# Patient Record
Sex: Female | Born: 1950 | Race: White | Hispanic: No | Marital: Married | Smoking: Former smoker
Health system: Southern US, Community
[De-identification: ages and names within clinical notes are randomized; demographics above are authoritative.]

## PROBLEM LIST (undated history)

## (undated) DIAGNOSIS — C801 Malignant (primary) neoplasm, unspecified: Secondary | ICD-10-CM

## (undated) DIAGNOSIS — Z8719 Personal history of other diseases of the digestive system: Secondary | ICD-10-CM

## (undated) DIAGNOSIS — J45909 Unspecified asthma, uncomplicated: Secondary | ICD-10-CM

## (undated) DIAGNOSIS — E039 Hypothyroidism, unspecified: Secondary | ICD-10-CM

## (undated) DIAGNOSIS — R51 Headache: Secondary | ICD-10-CM

## (undated) DIAGNOSIS — E119 Type 2 diabetes mellitus without complications: Secondary | ICD-10-CM

## (undated) DIAGNOSIS — K219 Gastro-esophageal reflux disease without esophagitis: Secondary | ICD-10-CM

## (undated) DIAGNOSIS — R112 Nausea with vomiting, unspecified: Secondary | ICD-10-CM

## (undated) DIAGNOSIS — Z9889 Other specified postprocedural states: Secondary | ICD-10-CM

## (undated) HISTORY — PX: TONSILLECTOMY: SUR1361

## (undated) HISTORY — PX: BUNIONECTOMY: SHX129

## (undated) HISTORY — PX: THYROID SURGERY: SHX805

## (undated) HISTORY — PX: KNEE ARTHROSCOPY: SUR90

## (undated) HISTORY — PX: FOOT SURGERY: SHX648

## (undated) HISTORY — PX: CARPAL TUNNEL RELEASE: SHX101

---

## 1997-08-11 ENCOUNTER — Other Ambulatory Visit: Admission: RE | Admit: 1997-08-11 | Discharge: 1997-08-11 | Payer: Self-pay | Admitting: Gynecology

## 1998-05-22 ENCOUNTER — Other Ambulatory Visit: Admission: RE | Admit: 1998-05-22 | Discharge: 1998-05-22 | Payer: Self-pay | Admitting: Gynecology

## 1998-05-22 ENCOUNTER — Other Ambulatory Visit: Admission: RE | Admit: 1998-05-22 | Discharge: 1998-05-22 | Payer: Self-pay | Admitting: Obstetrics and Gynecology

## 1998-06-18 ENCOUNTER — Ambulatory Visit (HOSPITAL_COMMUNITY): Admission: RE | Admit: 1998-06-18 | Discharge: 1998-06-18 | Payer: Self-pay | Admitting: Obstetrics and Gynecology

## 1998-11-16 ENCOUNTER — Ambulatory Visit (HOSPITAL_COMMUNITY): Admission: RE | Admit: 1998-11-16 | Discharge: 1998-11-16 | Payer: Self-pay | Admitting: Gastroenterology

## 1999-02-03 ENCOUNTER — Encounter: Admission: RE | Admit: 1999-02-03 | Discharge: 1999-02-03 | Payer: Self-pay | Admitting: Orthopaedic Surgery

## 1999-02-03 ENCOUNTER — Encounter: Payer: Self-pay | Admitting: Orthopaedic Surgery

## 1999-02-12 ENCOUNTER — Encounter: Payer: Self-pay | Admitting: Orthopaedic Surgery

## 1999-02-12 ENCOUNTER — Encounter: Admission: RE | Admit: 1999-02-12 | Discharge: 1999-02-12 | Payer: Self-pay | Admitting: Orthopaedic Surgery

## 1999-02-15 ENCOUNTER — Encounter: Admission: RE | Admit: 1999-02-15 | Discharge: 1999-02-15 | Payer: Self-pay | Admitting: Orthopaedic Surgery

## 1999-02-15 ENCOUNTER — Encounter: Payer: Self-pay | Admitting: Orthopaedic Surgery

## 1999-04-20 ENCOUNTER — Encounter: Payer: Self-pay | Admitting: Orthopaedic Surgery

## 1999-04-20 ENCOUNTER — Encounter: Admission: RE | Admit: 1999-04-20 | Discharge: 1999-04-20 | Payer: Self-pay | Admitting: Orthopaedic Surgery

## 1999-05-17 ENCOUNTER — Encounter: Payer: Self-pay | Admitting: Urology

## 1999-05-17 ENCOUNTER — Encounter: Admission: RE | Admit: 1999-05-17 | Discharge: 1999-05-17 | Payer: Self-pay | Admitting: Urology

## 1999-06-16 ENCOUNTER — Inpatient Hospital Stay (HOSPITAL_COMMUNITY): Admission: RE | Admit: 1999-06-16 | Discharge: 1999-06-18 | Payer: Self-pay | Admitting: Obstetrics and Gynecology

## 1999-06-16 ENCOUNTER — Encounter (INDEPENDENT_AMBULATORY_CARE_PROVIDER_SITE_OTHER): Payer: Self-pay | Admitting: Specialist

## 2001-02-16 ENCOUNTER — Other Ambulatory Visit: Admission: RE | Admit: 2001-02-16 | Discharge: 2001-02-16 | Payer: Self-pay | Admitting: Obstetrics and Gynecology

## 2002-03-29 ENCOUNTER — Other Ambulatory Visit: Admission: RE | Admit: 2002-03-29 | Discharge: 2002-03-29 | Payer: Self-pay | Admitting: Obstetrics and Gynecology

## 2002-04-18 HISTORY — PX: DILATION AND CURETTAGE OF UTERUS: SHX78

## 2003-04-19 HISTORY — PX: ABDOMINAL HYSTERECTOMY: SHX81

## 2003-04-23 ENCOUNTER — Other Ambulatory Visit: Admission: RE | Admit: 2003-04-23 | Discharge: 2003-04-23 | Payer: Self-pay | Admitting: Obstetrics and Gynecology

## 2004-06-04 ENCOUNTER — Other Ambulatory Visit: Admission: RE | Admit: 2004-06-04 | Discharge: 2004-06-04 | Payer: Self-pay | Admitting: Obstetrics and Gynecology

## 2005-03-31 ENCOUNTER — Ambulatory Visit (HOSPITAL_COMMUNITY): Admission: RE | Admit: 2005-03-31 | Discharge: 2005-04-01 | Payer: Self-pay | Admitting: General Surgery

## 2005-03-31 ENCOUNTER — Encounter (INDEPENDENT_AMBULATORY_CARE_PROVIDER_SITE_OTHER): Payer: Self-pay | Admitting: *Deleted

## 2005-04-18 HISTORY — PX: CHOLECYSTECTOMY: SHX55

## 2006-05-11 ENCOUNTER — Ambulatory Visit (HOSPITAL_COMMUNITY): Admission: RE | Admit: 2006-05-11 | Discharge: 2006-05-11 | Payer: Self-pay | Admitting: Endocrinology

## 2006-05-30 ENCOUNTER — Encounter (HOSPITAL_COMMUNITY): Admission: RE | Admit: 2006-05-30 | Discharge: 2006-08-03 | Payer: Self-pay | Admitting: Endocrinology

## 2006-06-12 ENCOUNTER — Other Ambulatory Visit: Admission: RE | Admit: 2006-06-12 | Discharge: 2006-06-12 | Payer: Self-pay | Admitting: Interventional Radiology

## 2006-06-12 ENCOUNTER — Encounter (INDEPENDENT_AMBULATORY_CARE_PROVIDER_SITE_OTHER): Payer: Self-pay | Admitting: Specialist

## 2006-06-12 ENCOUNTER — Encounter: Admission: RE | Admit: 2006-06-12 | Discharge: 2006-06-12 | Payer: Self-pay | Admitting: Endocrinology

## 2007-01-05 ENCOUNTER — Ambulatory Visit (HOSPITAL_COMMUNITY): Admission: RE | Admit: 2007-01-05 | Discharge: 2007-01-05 | Payer: Self-pay | Admitting: Endocrinology

## 2007-02-27 ENCOUNTER — Encounter: Admission: RE | Admit: 2007-02-27 | Discharge: 2007-02-27 | Payer: Self-pay | Admitting: Rheumatology

## 2007-07-20 ENCOUNTER — Encounter: Admission: RE | Admit: 2007-07-20 | Discharge: 2007-07-20 | Payer: Self-pay | Admitting: Endocrinology

## 2007-11-20 ENCOUNTER — Encounter (INDEPENDENT_AMBULATORY_CARE_PROVIDER_SITE_OTHER): Payer: Self-pay | Admitting: Surgery

## 2007-11-20 ENCOUNTER — Ambulatory Visit (HOSPITAL_COMMUNITY): Admission: RE | Admit: 2007-11-20 | Discharge: 2007-11-21 | Payer: Self-pay | Admitting: Surgery

## 2007-12-31 ENCOUNTER — Ambulatory Visit (HOSPITAL_COMMUNITY): Admission: RE | Admit: 2007-12-31 | Discharge: 2008-01-02 | Payer: Self-pay | Admitting: Endocrinology

## 2009-12-23 ENCOUNTER — Encounter: Admission: RE | Admit: 2009-12-23 | Discharge: 2009-12-23 | Payer: Self-pay | Admitting: Surgery

## 2010-05-09 ENCOUNTER — Encounter: Payer: Self-pay | Admitting: Endocrinology

## 2010-05-10 ENCOUNTER — Encounter: Payer: Self-pay | Admitting: Endocrinology

## 2010-05-27 IMAGING — NM NM RAI THERAPY CANCER THYROID
1 series · 1 of 1 positions shown · non-contrast
Comparison: None

CLINICAL DATA: Thyroid cancer

NUCLEAR MEDICINE RADIOACTIVE IODINE THERAPY FOR THYROID CANCER
Radiopharmaceutical: 104.9 mCi H-MKM.  The patient received 2
injections of Thyrogen 0.9 mg each on [REDACTED] and [REDACTED] of this
week.

[Series 1: st static image · 1 of 1 slices shown]
[im 1/1]
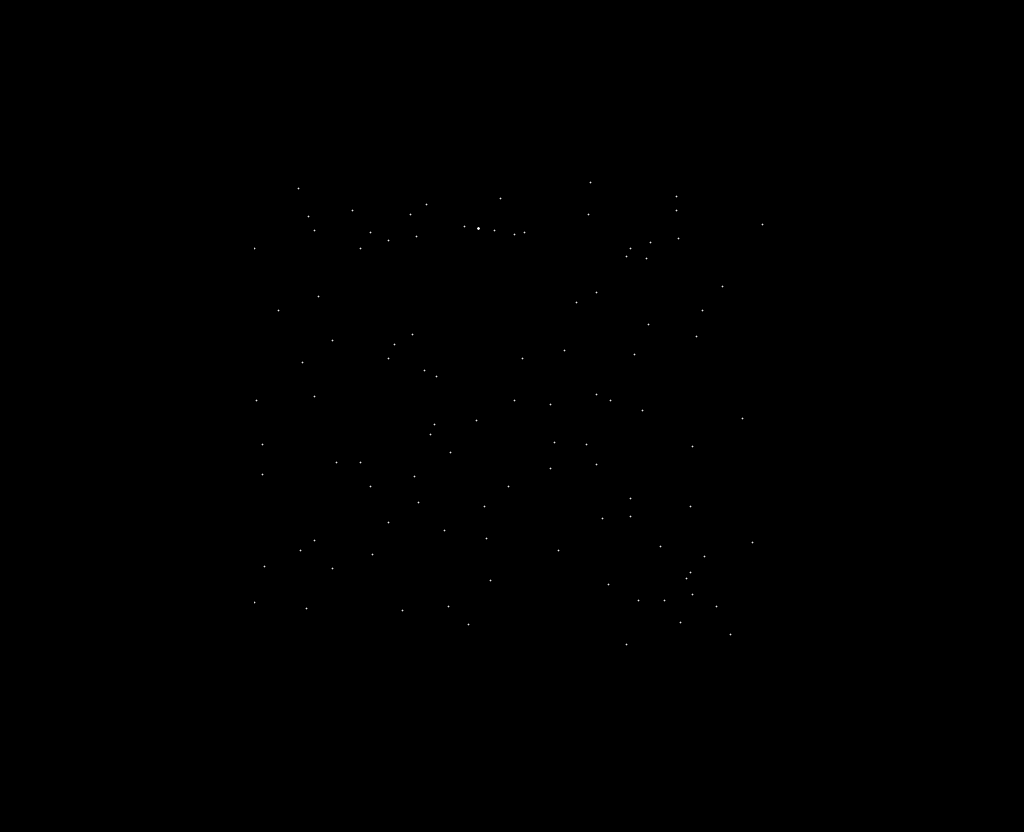

[1 of 1 positions shown; findings below may reference images not displayed]

FINDINGS: Written informed consent was obtained.  I explained the
treatment to the patient in detail including risks, complications
and precautions.  All questions were answered.
IMPRESSION: 1.  Treatment of thyroid cancer with 104.9 mCi H-MKM orally

## 2010-08-31 NOTE — Op Note (Signed)
NAMEAURIAH, [name]                 ACCOUNT NO.:  [id number]   MEDICAL RECORD NO.:  [id number]          PATIENT TYPE:  AMB   LOCATION:  DAY                          FACILITY:  [facility]   PHYSICIAN:  Stephanie Heckler, MD      DATE OF BIRTH:  11/04/1950   DATE OF PROCEDURE:  11/20/2007  DATE OF DISCHARGE:                               OPERATIVE REPORT   PREOPERATIVE DIAGNOSIS:  Multinodular thyroid goiter with compressive  symptoms.   POSTOPERATIVE DIAGNOSIS:  Multinodular thyroid goiter with compressive  symptoms.   PROCEDURE:  Total thyroidectomy.   SURGEON:  Stephanie Heckler, MD, FACS   ASSISTANT:  Stephanie Bent, MD, FACS   ANESTHESIA:  General per [name]. [name], M.D.   ESTIMATED BLOOD LOSS:  Minimal.   PREPARATION:  Betadine.   COMPLICATIONS:  None.   INDICATIONS:  The patient is a 60 year old white female from [place],  [place].  She is referred by [name], M.D. with known  bilateral thyroid nodules, thyroid goiter, and development of mild  compressive symptoms.  Ultrasound showed an increase in size of a right-  sided dominant nodule with internal calcifications.  The patient now  comes to surgery for resection.   BODY OF REPORT:  Procedure is done in OR #6 at the [facility].  The patient is brought to the operating room, placed in  supine position on the operating room table.  Following administration  of general anesthesia, the patient is positioned and then prepped and  draped in the usual strict aseptic fashion.  After ascertaining that an  adequate level of anesthesia been achieved, a Kocher incision is made a  #15 blade.  Dissection is carried through subcutaneous tissues and  platysma.  Hemostasis was obtained with electrocautery.  Skin flaps were  elevated cephalad and caudad from the thyroid notch to the sternal  notch.  A Mahorner self-retaining retractor was placed for exposure.  Strap muscles were incised in the midline  and dissection was begun on  the left side of the neck.  There is a dominant nodule in the thyroid  isthmus.  Left lobe was mildly enlarged and is quite firm.  Strap  muscles were reflected laterally.  Venous tributaries were divided  between hemoclips with the harmonic scalpel.  Gland is gently mobilized.  Superior pole is dissected out.  Superior pole vessels were ligated in  continuity with 2-0 silk ties and medium hemoclips and divided with the  harmonic scalpel.  Gland is rolled anteriorly.  Branches of the inferior  thyroid artery are divided between small hemoclips with the harmonic  scalpel.  Parathyroid tissue was identified and preserved.  Recurrent  nerve was identified and preserved.  Ligament of Allyson Sabal is transected  with electrocautery and the gland is mobilized up and onto the anterior  trachea.  A small pyramidal lobe was dissected away from the thyroid  cartilage and hemostasis obtained with electrocautery.   Next we turned our attention to the right lobe.  The right thyroid lobe  shows a dominant nodule in the medial portion  of the lobe adjacent to  the isthmus.  Also there is a smaller less than 1 cm calcified nodule  posteriorly.  With some difficulty the lobe was exposed by reflecting  the strap muscles laterally.  Venous tributaries were divided between  Ligaclips with the harmonic scalpel.  Superior pole vessels were  dissected out and ligated in continuity with 2-0 silk ties and divided  with the harmonic scalpel.  Gland is rolled anteriorly.  Branches of the  inferior thyroid artery are divided between small hemoclips.  Parathyroid tissue was identified superiorly and inferiorly and both of  these were preserved.  Recurrent nerve was identified and preserved.  Gland is rolled medially and the ligament of Allyson Sabal is transected with  electrocautery.  Gland is excised off the anterior trachea.  Sutures  used to mark the right superior pole.  The entire gland is  submitted to  pathology for review.  Neck is irrigated with warm saline and hemostasis  was obtained with electrocautery.  Surgicel was placed in the operative  field bilaterally.  Strap muscles were reapproximated in the midline  with interrupted 3-0 Vicryl sutures.  Platysma was closed with  interrupted 3-0 Vicryl sutures.  Skin was closed with running 4-0  Monocryl subcuticular suture.  Wound is washed and dried.  Benzoin and  Steri-Strips are applied.  Sterile dressings were applied.  The patient  was awakened from anesthesia and brought to the recovery room in stable  condition.  The patient tolerated the procedure well.      Stephanie Heckler, MD  Electronically Signed     TMG/MEDQ  D:  11/20/2007  T:  11/20/2007  Job:  [id number]   cc:   [name], M.D.  Fax: [phone]   [name], M.D.   Dr. Foye Barker

## 2010-09-03 NOTE — Op Note (Signed)
NAMEELLIA, KNOWLTON                 ACCOUNT NO.:  [id number]   MEDICAL RECORD NO.:  [id number]          PATIENT TYPE:  AMB   LOCATION:  SDS                          FACILITY:  [facility]   PHYSICIAN:  Cherylynn Ridges, M.D.    DATE OF BIRTH:  Apr 27, 1950   DATE OF PROCEDURE:  03/31/2005  DATE OF DISCHARGE:                                 OPERATIVE REPORT   PREOPERATIVE DIAGNOSIS:  Symptomatic biliary dyskinesia.   POSTOPERATIVE DIAGNOSIS:  Symptomatic biliary dyskinesia.   PROCEDURE:  Laparoscopic cholecystectomy with cholangiogram.   SURGEON:  Cherylynn Ridges, M.D.   ASSISTANT:  Currie Paris, M.D.   ANESTHESIA:  General endotracheal.   ESTIMATED BLOOD LOSS:  Less than 20 mL.   COMPLICATIONS:  None.   CONDITION:  Stable.   INDICATIONS FOR OPERATION:  The patient is a 60 year old with a dyskinetic  gallbladder and symptoms related to that who now comes in for an elective  laparoscopic cholecystectomy.   FINDINGS:  The patient had no evidence of adhesions or acute gallbladder  disease.  Cholangiogram was normal with good flow into the duodenum, no  filling defects, no dilated common bile ducts and good proximal flow.   OPERATION:  The patient was taken to the operating room and placed on the  table in a supine position.  After an adequate endotracheal anesthetic was  administered, she was prepped and draped in the usual sterile manner,  exposing the midline and the right upper quadrant of the abdomen.   A supraumbilical curvilinear incision was made using a #11 blade and taken  down to the midline fascia.  It was at this level that the fascia was  grasped with Kocher clamps and with retractors in place, we made an incision  between the Kocher clamps into the fascia, into the preperitoneal area.  We  then used a Kelly clamp to bluntly dissect into the peritoneal cavity.  Once  this was done, a pursestring suture of 0 Vicryl was passed around the  fascial opening and then a  Hasson cannula passed into the peritoneal cavity.  This secured in place with the pursestring 0 Vicryl stitch.   Carbon dioxide insufflation was instilled through the cannula into the  peritoneal cavity up to a maximal intra-abdominal pressure of 15 mmHg.  We  then passed 2 right costal margin 5-mm cannulas and a subxiphoid 11/12-mm  cavity under direct vision into the peritoneal cavity.  Once all cannulas  were placed, the patient was placed in reversed Trendelenburg position, the  left side was tilted down and the dissection begun.   The gallbladder was grasped and retracted towards the right upper quadrant.  We then dissected out the peritoneum overlying the triangle of Fallot and  the hepatoduodenal triangle.  There was an artery that coursed lateral to  the cystic duct which was then clipped doubly and subsequently transected.  We then dissected out the cystic duct and performed a choledochotomy after  passing a clip along the gallbladder side.  It was through the  choledochotomy that a [facility], which had been passed  through  the anterior abdominal wall, was passed in order to perform cholangiogram.  The cholangiogram showed good flow into the duodenum, good proximal filling  of the hepatic ducts, no intraductal filling defects and no dilatation.  Once this was demonstrated, we removed the clips and the catheter, then  endoclipped the distal duct x2 and then transected the cystic duct.  We then  dissected out what appeared to be a large cystic artery with branches going  to the gallbladder; these were individually clipped x2 and then cauterized  with a clip.  During the cautery of one of these branches, we punched the  gallbladder with spillage of clear bile.  We dissected out the gallbladder  from its bed with minimal difficulty, using an EndoCatch bag to bring it out  through the supraumbilical fascia with minimal difficulty.  We then closed  out the fascia side of  the supraumbilical site using a pursestring suture of  0 Vicryl.  We irrigated with 2 L of saline in the right upper quadrant  because of spillage of bile.  There was minimal bleeding in the gallbladder  bed, but it was controlled with electrocautery.  No drains were left in  place.  All counts were correct and once this was done, we removed all fluid  and gas using the aspirator and once this was done, we closed.  Each site  was closed using a running subcuticular stitch of 4-0 Vicryl.  0.25%  Marcaine with epinephrine was injected at all sites prior to closure.  Sterile dressings were applied.      Cherylynn Ridges, M.D.  Electronically Signed     JOW/MEDQ  D:  03/31/2005  T:  04/04/2005  Job:  [id number]

## 2011-01-14 LAB — CBC
HCT: 41.4
MCHC: 34.4
MCV: 90.1
RBC: 4.59
WBC: 5.5

## 2011-01-14 LAB — URINALYSIS, ROUTINE W REFLEX MICROSCOPIC
Protein, ur: NEGATIVE
Urobilinogen, UA: 0.2

## 2011-01-14 LAB — COMPREHENSIVE METABOLIC PANEL
ALT: 22
Alkaline Phosphatase: 50
Calcium: 9.6
Creatinine, Ser: 0.47
GFR calc Af Amer: >60
Potassium: 3.8

## 2011-01-14 LAB — DIFFERENTIAL
Basophils Absolute: 0
Eosinophils Absolute: 0.1
Eosinophils Relative: 1
Lymphocytes Relative: 30
Monocytes Absolute: 0.5
Neutro Abs: 3.3

## 2011-01-14 LAB — PROTIME-INR: Prothrombin Time: 12.6

## 2012-02-08 ENCOUNTER — Other Ambulatory Visit: Payer: Self-pay | Admitting: Obstetrics and Gynecology

## 2012-05-17 IMAGING — US US SOFT TISSUE HEAD/NECK
1 series · 14 of 25 positions shown · non-contrast
Comparison: Ultrasound of the thyroid pre thyroidectomy on
07/20/2007

CLINICAL DATA: Question of palpable abnormality in the left
supraclavicular region, prior thyroidectomy, on Synthroid

THYROID ULTRASOUND
TECHNIQUE: Ultrasound examination of the thyroid bed and adjacent
soft tissues was performed.

[Series 1: us soft tissue head/neck · 0.07mm/px · 14 of 56 slices shown]
[im 1/56]
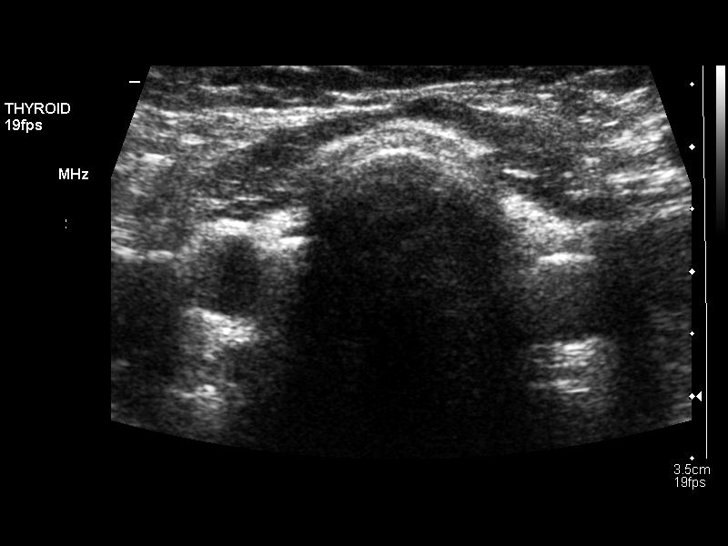
[im 5/56]
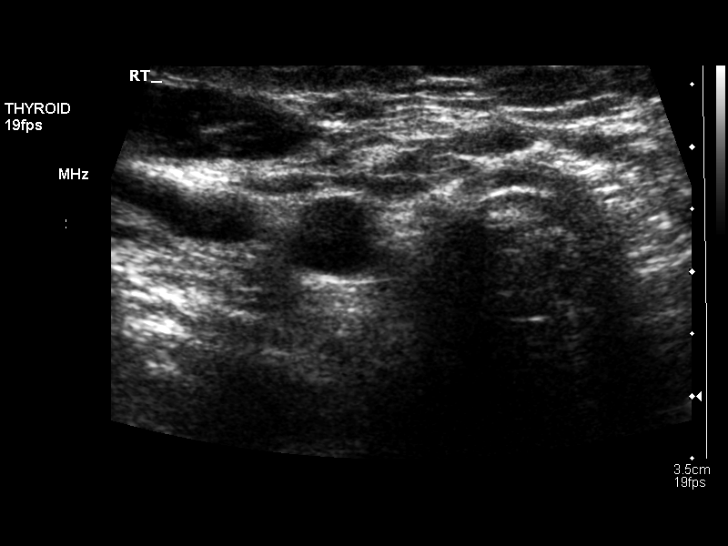
[im 10/56]
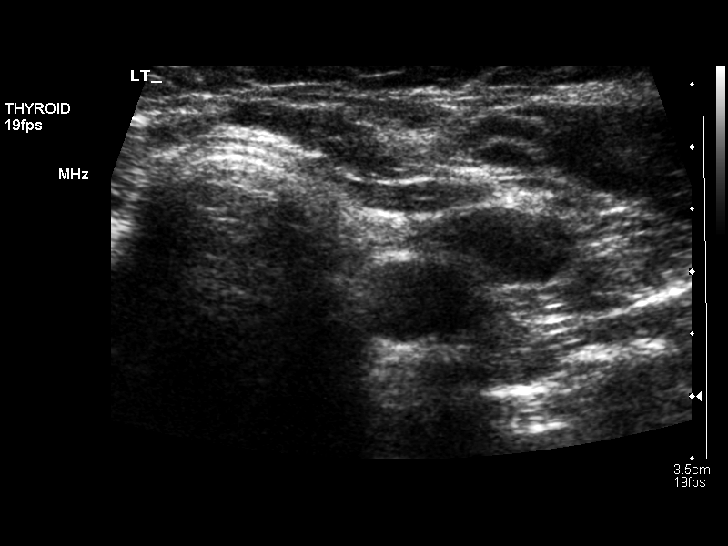
[im 14/56]
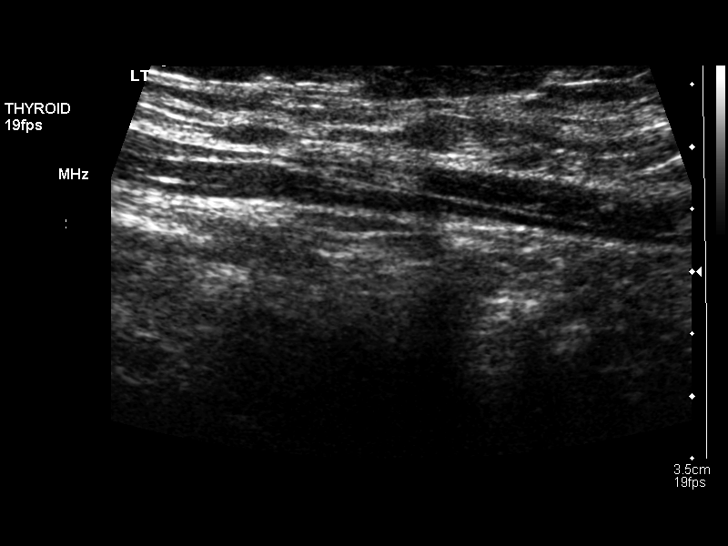
[im 19/56]
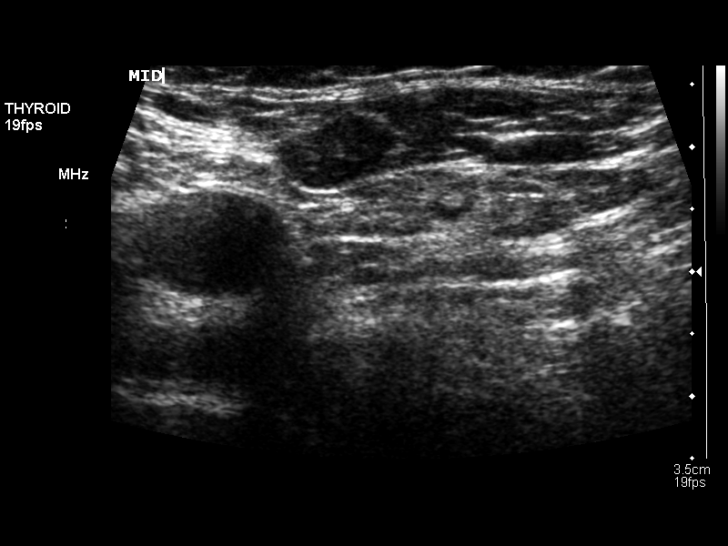
[im 21/56]
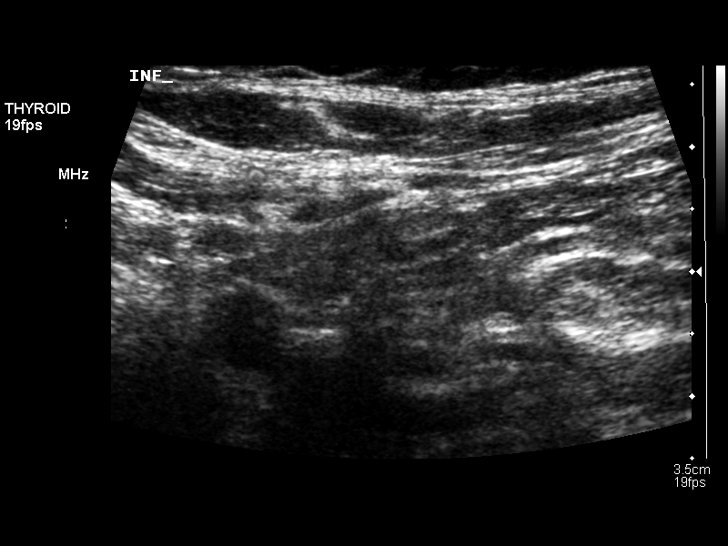
[im 26/56]
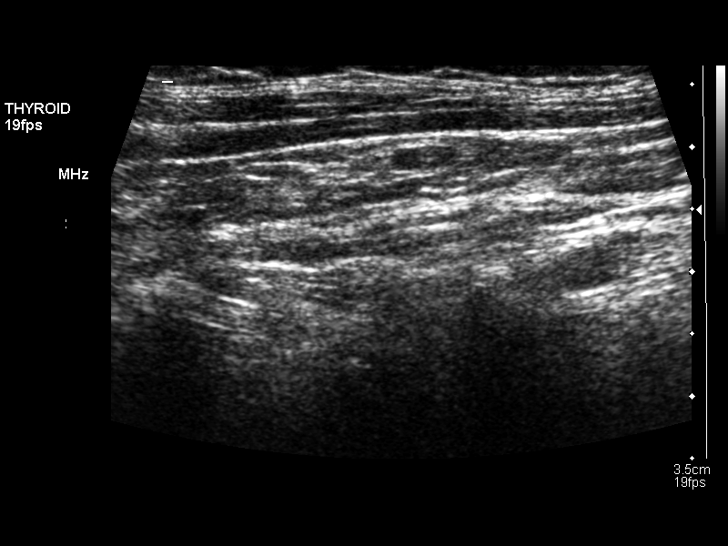
[im 30/56]
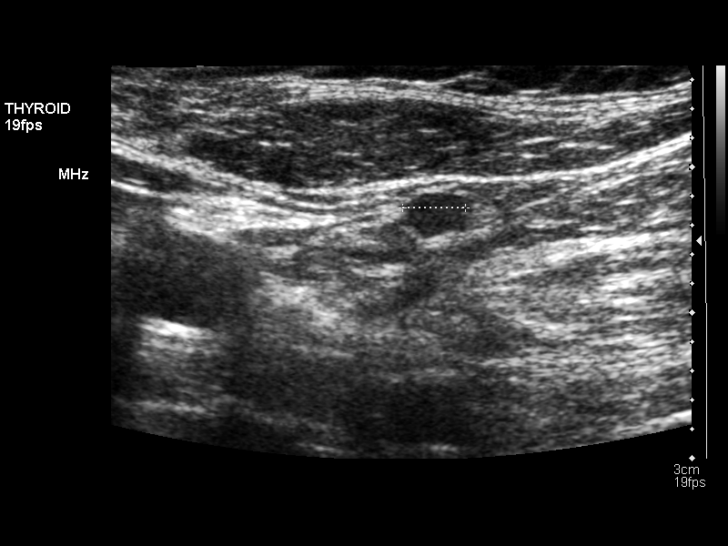
[im 35/56]
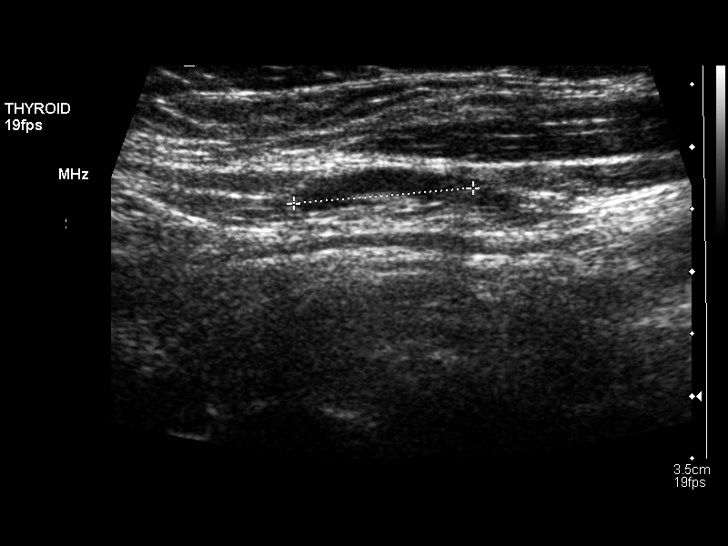
[im 37/56]
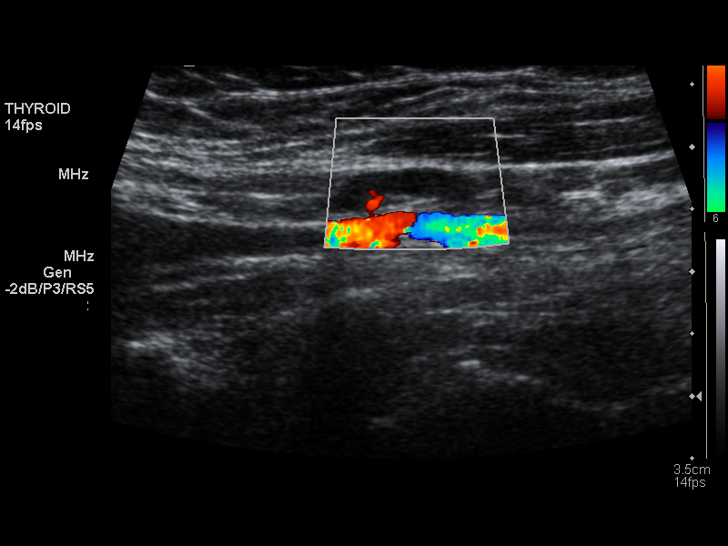
[im 42/56]
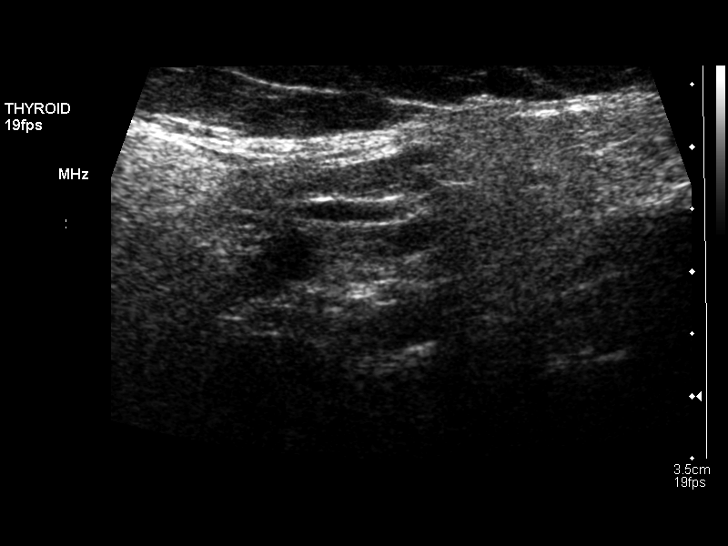
[im 46/56]
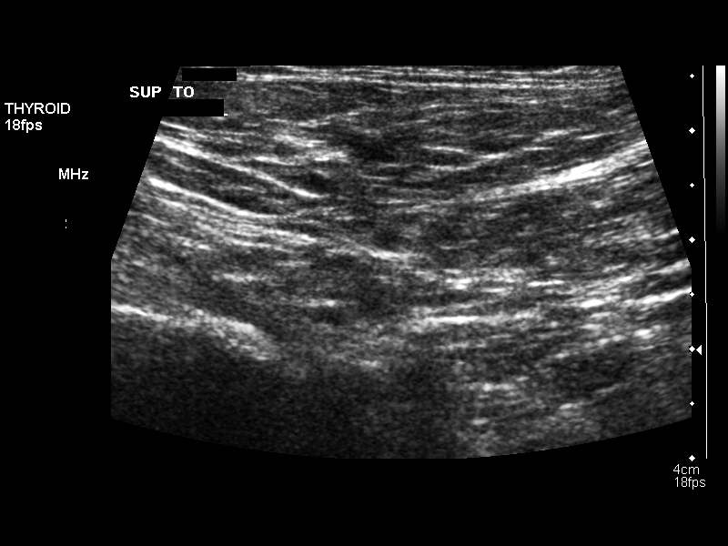
[im 51/56]
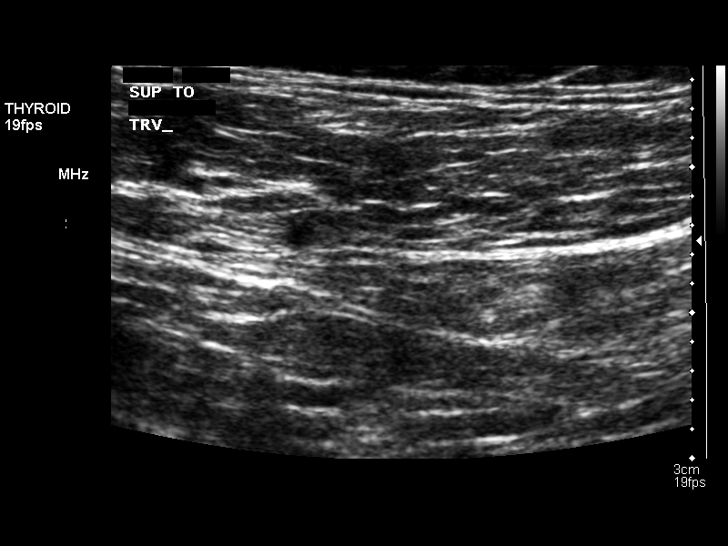
[im 56/56]
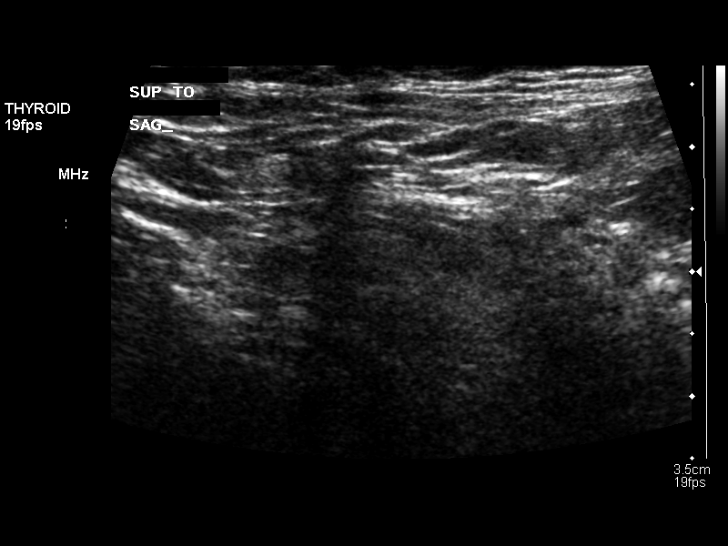

[14 of 25 positions shown; findings below may reference images not displayed]

FINDINGS: The thyroid is surgically absent.  No mass in the
thyroid bed.

Lymphadenopathy:  There are a few nodes bilaterally none of which
appear pathologically enlarged, appearing relatively normal by
ultrasound.  No mass is seen in the area questioned on palpation.
IMPRESSION: Normal post-thyroidectomy appearance. No adenopathy.

## 2013-10-11 ENCOUNTER — Other Ambulatory Visit: Payer: Self-pay | Admitting: Orthopedic Surgery

## 2013-10-21 ENCOUNTER — Encounter (HOSPITAL_COMMUNITY): Payer: Self-pay | Admitting: Pharmacy Technician

## 2013-10-23 ENCOUNTER — Other Ambulatory Visit (HOSPITAL_COMMUNITY): Payer: Self-pay | Admitting: *Deleted

## 2013-10-23 NOTE — Pre-Procedure Instructions (Signed)
TIMIYA HOWELLS  10/23/2013   Your procedure is scheduled on:  Monday, November 04, 2013 at 7:30 AM.   Report to [facility] Entrance "A" Admitting Office at 5:30 AM.   Call this number if you have problems the morning of surgery: [phone]   Remember:   Do not eat food or drink liquids after midnight Sunday, 11/03/13.   Take these medicines the morning of surgery with A SIP OF WATER: levothyroxine (SYNTHROID, LEVOTHROID)  Stop Vitamins as of Monday, 10/28/13.    Do not wear jewelry, make-up or nail polish.  Do not wear lotions, powders, or perfumes. You may wear deodorant.  Do not shave 48 hours prior to surgery.   Do not bring valuables to the hospital.  [facility] is not responsible                  for any belongings or valuables.               Contacts, dentures or bridgework may not be worn into surgery.  Leave suitcase in the car. After surgery it may be brought to your room.  For patients admitted to the hospital, discharge time is determined by your                treatment team.                Special Instructions: [place] - Preparing for Surgery  Before surgery, you can play an important role.  Because skin is not sterile, your skin needs to be as free of germs as possible.  You can reduce the number of germs on you skin by washing with CHG (chlorahexidine gluconate) soap before surgery.  CHG is an antiseptic cleaner which kills germs and bonds with the skin to continue killing germs even after washing.  Please DO NOT use if you have an allergy to CHG or antibacterial soaps.  If your skin becomes reddened/irritated stop using the CHG and inform your nurse when you arrive at Short Stay.  Do not shave (including legs and underarms) for at least 48 hours prior to the first CHG shower.  You may shave your face.  Please follow these instructions carefully:   1.  Shower with CHG Soap the night before surgery and the                                morning of  Surgery.  2.  If you choose to wash your hair, wash your hair first as usual with your       normal shampoo.  3.  After you shampoo, rinse your hair and body thoroughly to remove the                      Shampoo.  4.  Use CHG as you would any other liquid soap.  You can apply chg directly       to the skin and wash gently with scrungie or a clean washcloth.  5.  Apply the CHG Soap to your body ONLY FROM THE NECK DOWN.        Do not use on open wounds or open sores.  Avoid contact with your eyes, ears, mouth and genitals (private parts).  Wash genitals (private parts) with your normal soap.  6.  Wash thoroughly, paying special attention to the area where your surgery  will be performed.  7.  Thoroughly rinse your body with warm water from the neck down.  8.  DO NOT shower/wash with your normal soap after using and rinsing off       the CHG Soap.  9.  Pat yourself dry with a clean towel.            10.  Wear clean pajamas.            11.  Place clean sheets on your bed the night of your first shower and do not        sleep with pets.  Day of Surgery  Do not apply any lotions the morning of surgery.  Please wear clean clothes to the hospital/surgery center.     Please read over the following fact sheets that you were given: Pain Booklet, Coughing and Deep Breathing, Blood Transfusion Information, MRSA Information and Surgical Site Infection Prevention

## 2013-10-24 ENCOUNTER — Encounter (HOSPITAL_COMMUNITY): Payer: Self-pay

## 2013-10-24 ENCOUNTER — Encounter (HOSPITAL_COMMUNITY)
Admission: RE | Admit: 2013-10-24 | Discharge: 2013-10-24 | Disposition: A | Discharge status: Home / Self Care | Payer: BC Managed Care – PPO | Source: Ambulatory Visit | Attending: Orthopedic Surgery | Admitting: Orthopedic Surgery

## 2013-10-24 DIAGNOSIS — Z01810 Encounter for preprocedural cardiovascular examination: Secondary | ICD-10-CM | POA: Insufficient documentation

## 2013-10-24 DIAGNOSIS — Z01812 Encounter for preprocedural laboratory examination: Secondary | ICD-10-CM | POA: Insufficient documentation

## 2013-10-24 DIAGNOSIS — Z01818 Encounter for other preprocedural examination: Secondary | ICD-10-CM | POA: Insufficient documentation

## 2013-10-24 HISTORY — DX: Other specified postprocedural states: Z98.890

## 2013-10-24 HISTORY — DX: Nausea with vomiting, unspecified: R11.2

## 2013-10-24 HISTORY — DX: Hypothyroidism, unspecified: E03.9

## 2013-10-24 HISTORY — DX: Unspecified asthma, uncomplicated: J45.909

## 2013-10-24 HISTORY — DX: Gastro-esophageal reflux disease without esophagitis: K21.9

## 2013-10-24 HISTORY — DX: Personal history of other diseases of the digestive system: Z87.19

## 2013-10-24 HISTORY — DX: Malignant (primary) neoplasm, unspecified: C80.1

## 2013-10-24 HISTORY — DX: Type 2 diabetes mellitus without complications: E11.9

## 2013-10-24 HISTORY — DX: Headache: R51

## 2013-10-24 LAB — COMPREHENSIVE METABOLIC PANEL
ALBUMIN: 4.1 g/dL (ref 3.5–5.2)
ALK PHOS: 52 U/L (ref 39–117)
ALT: 20 U/L (ref 0–35)
AST: 22 U/L (ref 0–37)
Anion gap: 16 — ABNORMAL HIGH (ref 5–15)
BUN: 11 mg/dL (ref 6–23)
CHLORIDE: 101 meq/L (ref 96–112)
CO2: 23 mEq/L (ref 19–32)
Calcium: 8.9 mg/dL (ref 8.4–10.5)
Creatinine, Ser: 0.49 mg/dL — ABNORMAL LOW (ref 0.50–1.10)
GFR calc Af Amer: >90 mL/min (ref >90)
GFR calc non Af Amer: >90 mL/min (ref >90)
Glucose, Bld: 97 mg/dL (ref 70–99)
Potassium: 4 mEq/L (ref 3.7–5.3)
SODIUM: 140 meq/L (ref 137–147)
Total Bilirubin: 0.2 mg/dL — ABNORMAL LOW (ref 0.3–1.2)
Total Protein: 7.4 g/dL (ref 6.0–8.3)

## 2013-10-24 LAB — CBC WITH DIFFERENTIAL/PLATELET
BASOS ABS: 0 10*3/uL (ref 0.0–0.1)
BASOS PCT: 0 % (ref 0–1)
Eosinophils Absolute: 0.1 10*3/uL (ref 0.0–0.7)
Eosinophils Relative: 2 % (ref 0–5)
HCT: 40.1 % (ref 36.0–46.0)
Hemoglobin: 13.5 g/dL (ref 12.0–15.0)
Lymphocytes Relative: 30 % (ref 12–46)
Lymphs Abs: 1.7 10*3/uL (ref 0.7–4.0)
MCH: 29.5 pg (ref 26.0–34.0)
MCHC: 33.7 g/dL (ref 30.0–36.0)
MCV: 87.7 fL (ref 78.0–100.0)
MONOS PCT: 7 % (ref 3–12)
Monocytes Absolute: 0.4 10*3/uL (ref 0.1–1.0)
NEUTROS ABS: 3.5 10*3/uL (ref 1.7–7.7)
Neutrophils Relative %: 61 % (ref 43–77)
Platelets: 280 10*3/uL (ref 150–400)
RBC: 4.57 MIL/uL (ref 3.87–5.11)
RDW: 12.8 % (ref 11.5–15.5)
WBC: 5.7 10*3/uL (ref 4.0–10.5)

## 2013-10-24 LAB — URINALYSIS, ROUTINE W REFLEX MICROSCOPIC
Bilirubin Urine: NEGATIVE
GLUCOSE, UA: NEGATIVE mg/dL
Hgb urine dipstick: NEGATIVE
Ketones, ur: NEGATIVE mg/dL
LEUKOCYTES UA: NEGATIVE
Nitrite: NEGATIVE
Protein, ur: NEGATIVE mg/dL
Urobilinogen, UA: 0.2 mg/dL (ref 0.0–1.0)
pH: 6.5 (ref 5.0–8.0)

## 2013-10-24 LAB — TYPE AND SCREEN
ABO/RH(D): A POS
Antibody Screen: NEGATIVE

## 2013-10-24 LAB — ABO/RH: ABO/RH(D): A POS

## 2013-10-24 LAB — PROTIME-INR
INR: 1.02 (ref 0.00–1.49)
Prothrombin Time: 13.4 seconds (ref 11.6–15.2)

## 2013-10-24 LAB — APTT: aPTT: 31 seconds (ref 24–37)

## 2013-10-24 LAB — SURGICAL PCR SCREEN
MRSA, PCR: NEGATIVE
Staphylococcus aureus: POSITIVE — AB

## 2013-10-24 NOTE — Progress Notes (Signed)
NOTIFIED PATIENT TO FILL RX FOR MUPIROCIN AND BEGIN USING AS DIRECTED.  MUPIROCIN WAS CALLED INTO [facility] [address] IN [place].

## 2013-10-25 LAB — URINE CULTURE
COLONY COUNT: NO GROWTH
Culture: NO GROWTH

## 2013-11-03 MED ORDER — VANCOMYCIN HCL IN DEXTROSE 1-5 GM/200ML-% IV SOLN
1000.0000 mg | INTRAVENOUS | Status: AC
  Administered 2013-11-04: 1000 mg via INTRAVENOUS
  Filled 2013-11-03: qty 200

## 2013-11-03 MED ORDER — CHLORHEXIDINE GLUCONATE 4 % EX LIQD
60.0000 mL | Freq: Once | CUTANEOUS | Status: DC
  Filled 2013-11-03: qty 60

## 2013-11-03 MED ORDER — SODIUM CHLORIDE 0.9 % IV SOLN
1000.0000 mg | INTRAVENOUS | Status: AC
  Administered 2013-11-04: 1000 mg via INTRAVENOUS
  Filled 2013-11-03: qty 10

## 2013-11-04 ENCOUNTER — Encounter (HOSPITAL_COMMUNITY): Payer: Self-pay | Admitting: *Deleted

## 2013-11-04 ENCOUNTER — Inpatient Hospital Stay (HOSPITAL_COMMUNITY)
Admission: RE | Admit: 2013-11-04 | Discharge: 2013-11-05 | DRG: 470 | Disposition: A | Discharge status: Home Health | Payer: BC Managed Care – PPO | Source: Ambulatory Visit | Attending: Orthopedic Surgery | Admitting: Orthopedic Surgery

## 2013-11-04 ENCOUNTER — Encounter (HOSPITAL_COMMUNITY): Payer: BC Managed Care – PPO | Admitting: Anesthesiology

## 2013-11-04 ENCOUNTER — Encounter (HOSPITAL_COMMUNITY)
Admission: RE | Disposition: A | Discharge status: Home Health | Payer: Self-pay | Source: Ambulatory Visit | Attending: Orthopedic Surgery

## 2013-11-04 ENCOUNTER — Inpatient Hospital Stay (HOSPITAL_COMMUNITY): Payer: BC Managed Care – PPO | Admitting: Anesthesiology

## 2013-11-04 DIAGNOSIS — M1710 Unilateral primary osteoarthritis, unspecified knee: Principal | ICD-10-CM | POA: Diagnosis present

## 2013-11-04 DIAGNOSIS — K219 Gastro-esophageal reflux disease without esophagitis: Secondary | ICD-10-CM | POA: Diagnosis present

## 2013-11-04 DIAGNOSIS — E119 Type 2 diabetes mellitus without complications: Secondary | ICD-10-CM | POA: Diagnosis present

## 2013-11-04 DIAGNOSIS — E039 Hypothyroidism, unspecified: Secondary | ICD-10-CM | POA: Diagnosis present

## 2013-11-04 DIAGNOSIS — Z87891 Personal history of nicotine dependence: Secondary | ICD-10-CM

## 2013-11-04 DIAGNOSIS — D62 Acute posthemorrhagic anemia: Secondary | ICD-10-CM | POA: Diagnosis not present

## 2013-11-04 DIAGNOSIS — Z85850 Personal history of malignant neoplasm of thyroid: Secondary | ICD-10-CM

## 2013-11-04 DIAGNOSIS — Z96659 Presence of unspecified artificial knee joint: Secondary | ICD-10-CM

## 2013-11-04 HISTORY — PX: TOTAL KNEE ARTHROPLASTY: SHX125

## 2013-11-04 LAB — CREATININE, SERUM
CREATININE: 0.43 mg/dL — AB (ref 0.50–1.10)
GFR calc Af Amer: >90 mL/min (ref >90)

## 2013-11-04 LAB — GLUCOSE, CAPILLARY
GLUCOSE-CAPILLARY: 120 mg/dL — AB (ref 70–99)
GLUCOSE-CAPILLARY: 130 mg/dL — AB (ref 70–99)
GLUCOSE-CAPILLARY: 144 mg/dL — AB (ref 70–99)
Glucose-Capillary: 90 mg/dL (ref 70–99)
Glucose-Capillary: 139 mg/dL — ABNORMAL HIGH (ref 70–99)

## 2013-11-04 LAB — CBC
HCT: 40.1 % (ref 36.0–46.0)
HEMOGLOBIN: 13.2 g/dL (ref 12.0–15.0)
MCH: 29.5 pg (ref 26.0–34.0)
MCHC: 32.9 g/dL (ref 30.0–36.0)
MCV: 89.7 fL (ref 78.0–100.0)
Platelets: 287 10*3/uL (ref 150–400)
RBC: 4.47 MIL/uL (ref 3.87–5.11)
RDW: 12.9 % (ref 11.5–15.5)
WBC: 10 10*3/uL (ref 4.0–10.5)

## 2013-11-04 SURGERY — ARTHROPLASTY, KNEE, TOTAL
Anesthesia: General | Site: Knee | Laterality: Left

## 2013-11-04 MED ORDER — DEXAMETHASONE SODIUM PHOSPHATE 4 MG/ML IJ SOLN
INTRAMUSCULAR | Status: DC | PRN
  Administered 2013-11-04: 8 mg via INTRAVENOUS

## 2013-11-04 MED ORDER — SENNOSIDES-DOCUSATE SODIUM 8.6-50 MG PO TABS: 1.0000 | ORAL_TABLET | Freq: Every evening | ORAL | Status: DC | PRN

## 2013-11-04 MED ORDER — METOCLOPRAMIDE HCL 5 MG/ML IJ SOLN
5.0000 mg | Freq: Three times a day (TID) | INTRAMUSCULAR | Status: DC | PRN
  Administered 2013-11-04: 10 mg via INTRAVENOUS
  Filled 2013-11-04: qty 2

## 2013-11-04 MED ORDER — LEVOTHYROXINE SODIUM 25 MCG PO TABS
225.0000 ug | ORAL_TABLET | ORAL | Status: DC
  Administered 2013-11-05: 225 ug via ORAL
  Filled 2013-11-04 (×2): qty 1

## 2013-11-04 MED ORDER — CELECOXIB 200 MG PO CAPS
200.0000 mg | ORAL_CAPSULE | Freq: Two times a day (BID) | ORAL | Status: DC
  Administered 2013-11-04 – 2013-11-05 (×3): 200 mg via ORAL
  Filled 2013-11-04 (×4): qty 1

## 2013-11-04 MED ORDER — ONDANSETRON HCL 4 MG/2ML IJ SOLN
INTRAMUSCULAR | Status: DC | PRN
  Administered 2013-11-04: 4 mg via INTRAVENOUS

## 2013-11-04 MED ORDER — ONDANSETRON HCL 4 MG/2ML IJ SOLN
INTRAMUSCULAR | Status: AC
  Filled 2013-11-04: qty 2

## 2013-11-04 MED ORDER — BUPIVACAINE LIPOSOME 1.3 % IJ SUSP
20.0000 mL | Freq: Once | INTRAMUSCULAR | Status: DC
  Filled 2013-11-04: qty 20

## 2013-11-04 MED ORDER — LIDOCAINE HCL (CARDIAC) 20 MG/ML IV SOLN
INTRAVENOUS | Status: AC
  Filled 2013-11-04: qty 5

## 2013-11-04 MED ORDER — OXYCODONE HCL 5 MG PO TABS
ORAL_TABLET | ORAL | Status: AC
  Administered 2013-11-04: 10:00:00
  Filled 2013-11-04: qty 2

## 2013-11-04 MED ORDER — ONDANSETRON HCL 4 MG/2ML IJ SOLN
4.0000 mg | Freq: Four times a day (QID) | INTRAMUSCULAR | Status: DC | PRN
  Administered 2013-11-04: 4 mg via INTRAVENOUS
  Filled 2013-11-04: qty 2

## 2013-11-04 MED ORDER — PANTOPRAZOLE SODIUM 40 MG PO TBEC
40.0000 mg | DELAYED_RELEASE_TABLET | Freq: Every day | ORAL | Status: DC
  Administered 2013-11-04 – 2013-11-05 (×2): 40 mg via ORAL
  Filled 2013-11-04: qty 1

## 2013-11-04 MED ORDER — FENTANYL CITRATE 0.05 MG/ML IJ SOLN
INTRAMUSCULAR | Status: AC
  Filled 2013-11-04: qty 5

## 2013-11-04 MED ORDER — LACTATED RINGERS IV SOLN
INTRAVENOUS | Status: DC | PRN
  Administered 2013-11-04 (×2): via INTRAVENOUS

## 2013-11-04 MED ORDER — PHENOL 1.4 % MT LIQD: 1.0000 | OROMUCOSAL | Status: DC | PRN

## 2013-11-04 MED ORDER — BUPIVACAINE LIPOSOME 1.3 % IJ SUSP
INTRAMUSCULAR | Status: DC | PRN
  Administered 2013-11-04: 20 mL

## 2013-11-04 MED ORDER — VANCOMYCIN HCL IN DEXTROSE 1-5 GM/200ML-% IV SOLN
1000.0000 mg | Freq: Two times a day (BID) | INTRAVENOUS | Status: AC
  Administered 2013-11-04: 1000 mg via INTRAVENOUS
  Filled 2013-11-04: qty 200

## 2013-11-04 MED ORDER — METHOCARBAMOL 500 MG PO TABS
ORAL_TABLET | ORAL | Status: AC
  Administered 2013-11-04: 10:00:00
  Filled 2013-11-04: qty 1

## 2013-11-04 MED ORDER — DEXTROSE 5 % IV SOLN: 500.0000 mg | Freq: Four times a day (QID) | INTRAVENOUS | Status: DC | PRN

## 2013-11-04 MED ORDER — METOCLOPRAMIDE HCL 10 MG PO TABS: 5.0000 mg | ORAL_TABLET | Freq: Three times a day (TID) | ORAL | Status: DC | PRN

## 2013-11-04 MED ORDER — EPHEDRINE SULFATE 50 MG/ML IJ SOLN
INTRAMUSCULAR | Status: AC
  Filled 2013-11-04: qty 1

## 2013-11-04 MED ORDER — DOCUSATE SODIUM 100 MG PO CAPS
100.0000 mg | ORAL_CAPSULE | Freq: Two times a day (BID) | ORAL | Status: DC
  Administered 2013-11-04 – 2013-11-05 (×2): 100 mg via ORAL
  Filled 2013-11-04 (×3): qty 1

## 2013-11-04 MED ORDER — ALUM & MAG HYDROXIDE-SIMETH 200-200-20 MG/5ML PO SUSP: 30.0000 mL | ORAL | Status: DC | PRN

## 2013-11-04 MED ORDER — BISACODYL 5 MG PO TBEC: 5.0000 mg | DELAYED_RELEASE_TABLET | Freq: Every day | ORAL | Status: DC | PRN

## 2013-11-04 MED ORDER — HYDROMORPHONE HCL PF 1 MG/ML IJ SOLN
INTRAMUSCULAR | Status: AC
  Administered 2013-11-04: 0.5 mg via INTRAVENOUS
  Filled 2013-11-04: qty 1

## 2013-11-04 MED ORDER — FENTANYL CITRATE 0.05 MG/ML IJ SOLN
INTRAMUSCULAR | Status: DC | PRN
Start: 2013-11-04 — End: 2013-11-04
  Administered 2013-11-04 (×7): 50 ug via INTRAVENOUS

## 2013-11-04 MED ORDER — LEVOTHYROXINE SODIUM 200 MCG PO TABS: 200.0000 ug | ORAL_TABLET | ORAL | Status: DC

## 2013-11-04 MED ORDER — FLEET ENEMA 7-19 GM/118ML RE ENEM: 1.0000 | ENEMA | Freq: Once | RECTAL | Status: AC | PRN

## 2013-11-04 MED ORDER — OXYCODONE HCL ER 10 MG PO T12A
10.0000 mg | EXTENDED_RELEASE_TABLET | Freq: Two times a day (BID) | ORAL | Status: DC
  Administered 2013-11-04 – 2013-11-05 (×2): 10 mg via ORAL
  Filled 2013-11-04 (×2): qty 1

## 2013-11-04 MED ORDER — LIDOCAINE HCL (CARDIAC) 20 MG/ML IV SOLN
INTRAVENOUS | Status: DC | PRN
  Administered 2013-11-04: 50 mg via INTRAVENOUS

## 2013-11-04 MED ORDER — FAMOTIDINE 20 MG PO TABS
20.0000 mg | ORAL_TABLET | Freq: Every day | ORAL | Status: DC
  Administered 2013-11-04: 20 mg via ORAL
  Filled 2013-11-04 (×2): qty 1

## 2013-11-04 MED ORDER — ESTRADIOL 0.1 MG/24HR TD PTWK: 0.1000 mg | MEDICATED_PATCH | TRANSDERMAL | Status: DC

## 2013-11-04 MED ORDER — BUPIVACAINE-EPINEPHRINE 0.5% -1:200000 IJ SOLN
INTRAMUSCULAR | Status: DC | PRN
  Administered 2013-11-04: 30 mL

## 2013-11-04 MED ORDER — LEVOTHYROXINE SODIUM 25 MCG PO TABS: 25.0000 ug | ORAL_TABLET | Freq: Every day | ORAL | Status: DC

## 2013-11-04 MED ORDER — HYDROMORPHONE HCL PF 1 MG/ML IJ SOLN
0.2500 mg | INTRAMUSCULAR | Status: DC | PRN
  Administered 2013-11-04 (×4): 0.5 mg via INTRAVENOUS

## 2013-11-04 MED ORDER — ROCURONIUM BROMIDE 50 MG/5ML IV SOLN
INTRAVENOUS | Status: AC
  Filled 2013-11-04: qty 1

## 2013-11-04 MED ORDER — MIDAZOLAM HCL 2 MG/2ML IJ SOLN
INTRAMUSCULAR | Status: AC
  Filled 2013-11-04: qty 2

## 2013-11-04 MED ORDER — OXYCODONE HCL 5 MG PO TABS
5.0000 mg | ORAL_TABLET | ORAL | Status: DC | PRN
Start: 2013-11-04 — End: 2013-11-05
  Administered 2013-11-04: 5 mg via ORAL
  Administered 2013-11-04 – 2013-11-05 (×5): 10 mg via ORAL
  Filled 2013-11-04 (×2): qty 2
  Filled 2013-11-04: qty 1
  Filled 2013-11-04 (×2): qty 2

## 2013-11-04 MED ORDER — PROPOFOL 10 MG/ML IV BOLUS
INTRAVENOUS | Status: AC
  Filled 2013-11-04: qty 20

## 2013-11-04 MED ORDER — SODIUM CHLORIDE 0.9 % IJ SOLN
INTRAMUSCULAR | Status: AC
  Filled 2013-11-04: qty 10

## 2013-11-04 MED ORDER — HYDROMORPHONE HCL PF 1 MG/ML IJ SOLN
INTRAMUSCULAR | Status: AC
  Administered 2013-11-04: 10:00:00
  Filled 2013-11-04: qty 1

## 2013-11-04 MED ORDER — ACETAMINOPHEN 650 MG RE SUPP: 650.0000 mg | Freq: Four times a day (QID) | RECTAL | Status: DC | PRN

## 2013-11-04 MED ORDER — PHENYLEPHRINE 40 MCG/ML (10ML) SYRINGE FOR IV PUSH (FOR BLOOD PRESSURE SUPPORT)
PREFILLED_SYRINGE | INTRAVENOUS | Status: AC
  Filled 2013-11-04: qty 10

## 2013-11-04 MED ORDER — ACETAMINOPHEN 325 MG PO TABS: 650.0000 mg | ORAL_TABLET | Freq: Four times a day (QID) | ORAL | Status: DC | PRN

## 2013-11-04 MED ORDER — SODIUM CHLORIDE 0.9 % IR SOLN
Status: DC | PRN
  Administered 2013-11-04: 1000 mL

## 2013-11-04 MED ORDER — ONDANSETRON HCL 4 MG/2ML IJ SOLN: 4.0000 mg | Freq: Once | INTRAMUSCULAR | Status: DC | PRN

## 2013-11-04 MED ORDER — PROPOFOL 10 MG/ML IV BOLUS
INTRAVENOUS | Status: DC | PRN
  Administered 2013-11-04: 180 mg via INTRAVENOUS

## 2013-11-04 MED ORDER — GLYCOPYRROLATE 0.2 MG/ML IJ SOLN
INTRAMUSCULAR | Status: AC
  Filled 2013-11-04: qty 1

## 2013-11-04 MED ORDER — MENTHOL 3 MG MT LOZG: 1.0000 | LOZENGE | OROMUCOSAL | Status: DC | PRN

## 2013-11-04 MED ORDER — METHOCARBAMOL 500 MG PO TABS
500.0000 mg | ORAL_TABLET | Freq: Four times a day (QID) | ORAL | Status: DC | PRN
  Administered 2013-11-04 – 2013-11-05 (×3): 500 mg via ORAL
  Filled 2013-11-04 (×2): qty 1

## 2013-11-04 MED ORDER — HYDROMORPHONE HCL PF 1 MG/ML IJ SOLN: 1.0000 mg | INTRAMUSCULAR | Status: DC | PRN

## 2013-11-04 MED ORDER — SODIUM CHLORIDE 0.9 % IV SOLN
INTRAVENOUS | Status: DC
  Administered 2013-11-04: 07:00:00 via INTRAVENOUS

## 2013-11-04 MED ORDER — MIDAZOLAM HCL 5 MG/5ML IJ SOLN
INTRAMUSCULAR | Status: DC | PRN
  Administered 2013-11-04: 2 mg via INTRAVENOUS

## 2013-11-04 MED ORDER — LEVOTHYROXINE SODIUM 200 MCG PO TABS: 200.0000 ug | ORAL_TABLET | Freq: Every day | ORAL | Status: DC

## 2013-11-04 MED ORDER — ZOLPIDEM TARTRATE 5 MG PO TABS
5.0000 mg | ORAL_TABLET | Freq: Every evening | ORAL | Status: DC | PRN
Start: 2013-11-04 — End: 2013-11-05

## 2013-11-04 MED ORDER — DIPHENHYDRAMINE HCL 12.5 MG/5ML PO ELIX: 12.5000 mg | ORAL_SOLUTION | ORAL | Status: DC | PRN

## 2013-11-04 MED ORDER — ONDANSETRON HCL 4 MG PO TABS: 4.0000 mg | ORAL_TABLET | Freq: Four times a day (QID) | ORAL | Status: DC | PRN

## 2013-11-04 MED ORDER — ENOXAPARIN SODIUM 30 MG/0.3ML ~~LOC~~ SOLN
30.0000 mg | Freq: Two times a day (BID) | SUBCUTANEOUS | Status: DC
  Administered 2013-11-05: 30 mg via SUBCUTANEOUS
  Filled 2013-11-04 (×3): qty 0.3

## 2013-11-04 MED ORDER — SUCCINYLCHOLINE CHLORIDE 20 MG/ML IJ SOLN
INTRAMUSCULAR | Status: AC
  Filled 2013-11-04: qty 1

## 2013-11-04 MED ORDER — SODIUM CHLORIDE 0.9 % IV SOLN
INTRAVENOUS | Status: DC
  Administered 2013-11-05: 07:00:00 via INTRAVENOUS

## 2013-11-04 SURGICAL SUPPLY — 58 items
BANDAGE ESMARK 6X9 LF (GAUZE/BANDAGES/DRESSINGS) ×1 IMPLANT
BLADE SAGITTAL 13X1.27X60 (BLADE) ×2 IMPLANT
BLADE SAGITTAL 13X1.27X60MM (BLADE) ×1
BLADE SAW SGTL 83.5X18.5 (BLADE) ×3 IMPLANT
BNDG CMPR 9X6 STRL LF SNTH (GAUZE/BANDAGES/DRESSINGS) ×1
BNDG ESMARK 6X9 LF (GAUZE/BANDAGES/DRESSINGS) ×3
BOWL SMART MIX CTS (DISPOSABLE) ×3 IMPLANT
CAP POR NKTM CP VIT E LN CER ×2 IMPLANT
CEMENT BONE SIMPLEX SPEEDSET (Cement) ×6 IMPLANT
COVER SURGICAL LIGHT HANDLE (MISCELLANEOUS) ×3 IMPLANT
CUFF TOURNIQUET SINGLE 34IN LL (TOURNIQUET CUFF) ×3 IMPLANT
DRAPE EXTREMITY T 121X128X90 (DRAPE) ×3 IMPLANT
DRAPE INCISE IOBAN 66X45 STRL (DRAPES) ×6 IMPLANT
DRAPE PROXIMA HALF (DRAPES) ×3 IMPLANT
DRAPE U-SHAPE 47X51 STRL (DRAPES) ×3 IMPLANT
DRSG ADAPTIC 3X8 NADH LF (GAUZE/BANDAGES/DRESSINGS) ×3 IMPLANT
DRSG PAD ABDOMINAL 8X10 ST (GAUZE/BANDAGES/DRESSINGS) ×3 IMPLANT
DURAPREP 26ML APPLICATOR (WOUND CARE) ×6 IMPLANT
ELECT REM PT RETURN 9FT ADLT (ELECTROSURGICAL) ×3
ELECTRODE REM PT RTRN 9FT ADLT (ELECTROSURGICAL) ×1 IMPLANT
EVACUATOR 1/8 PVC DRAIN (DRAIN) ×3 IMPLANT
GLOVE BIOGEL M 7.0 STRL (GLOVE) IMPLANT
GLOVE BIOGEL PI IND STRL 6.5 (GLOVE) IMPLANT
GLOVE BIOGEL PI IND STRL 7.5 (GLOVE) IMPLANT
GLOVE BIOGEL PI IND STRL 8.5 (GLOVE) ×2 IMPLANT
GLOVE BIOGEL PI INDICATOR 6.5 (GLOVE) ×2
GLOVE BIOGEL PI INDICATOR 7.5 (GLOVE) ×2
GLOVE BIOGEL PI INDICATOR 8.5 (GLOVE) ×4
GLOVE SURG ORTHO 8.0 STRL STRW (GLOVE) ×6 IMPLANT
GOWN STRL REUS W/ TWL LRG LVL3 (GOWN DISPOSABLE) ×2 IMPLANT
GOWN STRL REUS W/ TWL XL LVL3 (GOWN DISPOSABLE) ×2 IMPLANT
GOWN STRL REUS W/TWL LRG LVL3 (GOWN DISPOSABLE) ×9
GOWN STRL REUS W/TWL XL LVL3 (GOWN DISPOSABLE)
HANDPIECE INTERPULSE COAX TIP (DISPOSABLE) ×3
HOOD PEEL AWAY FACE SHEILD DIS (HOOD) ×10 IMPLANT
KIT BASIN OR (CUSTOM PROCEDURE TRAY) ×3 IMPLANT
KIT ROOM TURNOVER OR (KITS) ×3 IMPLANT
MANIFOLD NEPTUNE II (INSTRUMENTS) ×3 IMPLANT
NEEDLE 22X1 1/2 (OR ONLY) (NEEDLE) ×6 IMPLANT
NS IRRIG 1000ML POUR BTL (IV SOLUTION) ×3 IMPLANT
PACK TOTAL JOINT (CUSTOM PROCEDURE TRAY) ×3 IMPLANT
PAD ARMBOARD 7.5X6 YLW CONV (MISCELLANEOUS) ×6 IMPLANT
PADDING CAST COTTON 6X4 STRL (CAST SUPPLIES) ×3 IMPLANT
SET HNDPC FAN SPRY TIP SCT (DISPOSABLE) ×1 IMPLANT
SPONGE GAUZE 4X4 12PLY (GAUZE/BANDAGES/DRESSINGS) ×3 IMPLANT
STAPLER VISISTAT 35W (STAPLE) ×3 IMPLANT
SUCTION FRAZIER TIP 10 FR DISP (SUCTIONS) ×3 IMPLANT
SUT BONE WAX W31G (SUTURE) ×3 IMPLANT
SUT VIC AB 0 CTB1 27 (SUTURE) ×6 IMPLANT
SUT VIC AB 1 CT1 27 (SUTURE) ×6
SUT VIC AB 1 CT1 27XBRD ANBCTR (SUTURE) ×2 IMPLANT
SUT VIC AB 2-0 CT1 27 (SUTURE) ×6
SUT VIC AB 2-0 CT1 TAPERPNT 27 (SUTURE) ×2 IMPLANT
SYR 20CC LL (SYRINGE) ×6 IMPLANT
TOWEL OR 17X24 6PK STRL BLUE (TOWEL DISPOSABLE) ×3 IMPLANT
TOWEL OR 17X26 10 PK STRL BLUE (TOWEL DISPOSABLE) ×3 IMPLANT
TRAY FOLEY CATH 14FR (SET/KITS/TRAYS/PACK) ×1 IMPLANT
WATER STERILE IRR 1000ML POUR (IV SOLUTION) ×6 IMPLANT

## 2013-11-04 NOTE — Evaluation (Signed)
Physical Therapy Evaluation Patient Details Name: [name] MRN: [id number] DOB: Aug 24, 1950 Today's Date: 11/04/2013   History of Present Illness  Patient is a 63 yo female admitted 11/04/13 now s/p Lt TKA.  PMH: DM, asthma  Clinical Impression  Patient presents with problems listed below.  Will benefit from acute PT to maximize independence prior to discharge home with husband.  Session limited today due to N/V.  RN notified. Currently husband only available thru Wednesday.  Encouraged them to contact family/friends to provide assist while husband at work Physiological scientist and possibly Fri.    Follow Up Recommendations Home health PT;Supervision/Assistance - 24 hour    Equipment Recommendations  None recommended by PT    Recommendations for Other Services       Precautions / Restrictions Precautions Precautions: Knee;Fall Precaution Booklet Issued: Yes (comment) Precaution Comments: Reviewed precautions with patient and husband. Restrictions Weight Bearing Restrictions: Yes LLE Weight Bearing: Weight bearing as tolerated      Mobility  Bed Mobility Overal bed mobility: Needs Assistance Bed Mobility: Supine to Sit;Sit to Supine     Supine to sit: Min assist Sit to supine: Min assist   General bed mobility comments: Verbal cues for technique. Assist to move LLE off of and onto bed.  Patient sat EOB x 12 minutes with good balance.  Patient very lightheaded and nauseated.  Patient vomited x3.  RN notified.  Transfers                    Ambulation/Gait                Stairs            Wheelchair Mobility    Modified Rankin (Stroke Patients Only)       Balance                                             Pertinent Vitals/Pain Pain 6/10 with mobility    Home Living Family/patient expects to be discharged to:: Private residence Living Arrangements: Spouse/significant other Available Help at Discharge: Family;Available 24 hours/day  (Husband thru Wed; possibly granddtr Thur/Fri) Type of Home: House Home Access: Stairs to enter Entrance Stairs-Rails: None Entrance Stairs-Number of Steps: 3 Home Layout: Two level;Bed/bath upstairs;1/2 bath on main level Home Equipment: Walker - 2 wheels;Bedside commode      Prior Function Level of Independence: Independent               Hand Dominance        Extremity/Trunk Assessment   Upper Extremity Assessment: Overall WFL for tasks assessed           Lower Extremity Assessment: LLE deficits/detail   LLE Deficits / Details: Decreased ROM/strength post-op.  Able to assist with moving LLE off of and onto bed.  Cervical / Trunk Assessment: Normal  Communication   Communication: No difficulties  Cognition Arousal/Alertness: Awake/alert Behavior During Therapy: Anxious (Nauseated/vomiting) Overall Cognitive Status: Within Functional Limits for tasks assessed                      General Comments      Exercises Total Joint Exercises Ankle Circles/Pumps: AROM;Both;10 reps;Supine      Assessment/Plan    PT Assessment Patient needs continued PT services  PT Diagnosis Difficulty walking;Generalized weakness;Acute pain   PT Problem List Decreased strength;Decreased range  of motion;Decreased activity tolerance;Decreased mobility;Decreased knowledge of use of DME;Decreased knowledge of precautions;Pain  PT Treatment Interventions DME instruction;Gait training;Stair training;Functional mobility training;Therapeutic activities;Therapeutic exercise;Patient/family education   PT Goals (Current goals can be found in the Care Plan section) Acute Rehab PT Goals Patient Stated Goal: To feel better PT Goal Formulation: With patient/family Time For Goal Achievement: 11/11/13 Potential to Achieve Goals: Good    Frequency 7X/week   Barriers to discharge Decreased caregiver support Husband available through Wednesday.  They are going to contact granddaughter  and friends for assist on Thursday/Friday.    Co-evaluation               End of Session Equipment Utilized During Treatment: Oxygen Activity Tolerance: Patient limited by fatigue (Limited by nausea/vomiting) Patient left: in bed;with call bell/phone within reach;with family/visitor present Nurse Communication: Mobility status (N/V)         Time: 9295-7473 PT Time Calculation (min): 32 min   Charges:   PT Evaluation $Initial PT Evaluation Tier I: 1 Procedure PT Treatments $Therapeutic Activity: 23-37 mins   PT G Codes:          Despina Pole 11/04/2013, 4:30 PM Carita Pian. Sanjuana Kava, [place] Pager [phone]

## 2013-11-04 NOTE — Progress Notes (Signed)
Orthopedic Tech Progress Note Patient Details:  KINSLEY HOLDERMAN 03-16-1951 [id number] On cpm at 8:00pm LLE 0-90 Patient ID: Rufina Falco, female   DOB: [birth year], 63 y.o.   MRN: [id number]   Braulio Bosch 11/04/2013, 7:58 PM

## 2013-11-04 NOTE — Op Note (Signed)
TOTAL KNEE REPLACEMENT OPERATIVE NOTE:  11/04/2013  4:34 PM  PATIENT:  [name]  63 y.o. female  PRE-OPERATIVE DIAGNOSIS:  osteoarthritis left knee  POST-OPERATIVE DIAGNOSIS:  osteoarthritis left knee  PROCEDURE:  Procedure(s): TOTAL KNEE ARTHROPLASTY  SURGEON:  Surgeon(s): Vickey Huger, MD  PHYSICIAN ASSISTANT: Carlynn Spry, [facility]  ANESTHESIA:   general  DRAINS: Hemovac  SPECIMEN: None  COUNTS:  Correct  TOURNIQUET:   Total Tourniquet Time Documented: Thigh (Left) - 53 minutes Total: Thigh (Left) - 53 minutes   DICTATION:  Indication for procedure:    The patient is a 63 y.o. female who has failed conservative treatment for osteoarthritis left knee.  Informed consent was obtained prior to anesthesia. The risks versus benefits of the operation were explain and in a way the patient can, and did, understand.   On the implant demand matching protocol, this patient scored 14.  Therefore, this patient was receive a polyethylene insert with vitamin E which is a high demand implant.  Description of procedure:     The patient was taken to the operating room and placed under anesthesia.  The patient was positioned in the usual fashion taking care that all body parts were adequately padded and/or protected.  I foley catheter was not placed.  A tourniquet was applied and the leg prepped and draped in the usual sterile fashion.  The extremity was exsanguinated with the esmarch and tourniquet inflated to 350 mmHg.  Pre-operative range of motion was normal.  The knee was in 5 degree of mild varus.  A midline incision approximately 6-7 inches long was made with a #10 blade.  A new blade was used to make a parapatellar arthrotomy going 2-3 cm into the quadriceps tendon, over the patella, and alongside the medial aspect of the patellar tendon.  A synovectomy was then performed with the #10 blade and forceps. I then elevated the deep MCL off the medial tibial metaphysis subperiosteally  around to the semimembranosus attachment.    I everted the patella and used calipers to measure patellar thickness.  I used the reamer to ream down to appropriate thickness to recreate the native thickness.  I then removed excess bone with the rongeur and sagittal saw.  I used the appropriately sized template and drilled the three lug holes.  I then put the trial in place and measured the thickness with the calipers to ensure recreation of the native thickness.  The trial was then removed and the patella subluxed and the knee brought into flexion.  A homan retractor was place to retract and protect the patella and lateral structures.  A Z-retractor was place medially to protect the medial structures.  The extra-medullary alignment system was used to make cut the tibial articular surface perpendicular to the anamotic axis of the tibia and in 3 degrees of posterior slope.  The cut surface and alignment jig was removed.  I then used the intramedullary alignment guide to make a 6 valgus cut on the distal femur.  I then marked out the epicondylar axis on the distal femur.  The posterior condylar axis measured 3 degrees.  I then used the anterior referencing sizer and measured the femur to be a size 9.  The 4-In-1 cutting block was screwed into place in external rotation matching the posterior condylar angle, making our cuts perpendicular to the epicondylar axis.  Anterior, posterior and chamfer cuts were made with the sagittal saw.  The cutting block and cut pieces were removed.  A lamina  spreader was placed in 90 degrees of flexion.  The ACL, PCL, menisci, and posterior condylar osteophytes were removed.  A 10 mm spacer blocked was found to offer good flexion and extension gap balance after mild in degree releasing.   The scoop retractor was then placed and the femoral finishing block was pinned in place.  The small sagittal saw was used as well as the lug drill to finish the femur.  The block and cut surfaces  were removed and the medullary canal hole filled with autograft bone from the cut pieces.  The tibia was delivered forward in deep flexion and external rotation.  A size E tray was selected and pinned into place centered on the medial 1/3 of the tibial tubercle.  The reamer and keel was used to prepare the tibia through the tray.    I then trialed with the size 9 femur, size E tibia, a 10 mm insert and the 32 patella.  I had excellent flexion/extension gap balance, excellent patella tracking.  Flexion was full and beyond 120 degrees; extension was zero.  These components were chosen and the staff opened them to me on the back table while the knee was lavaged copiously and the cement mixed.  The soft tissue was infiltrated with 60cc of exparel 1.3% through a 21 gauge needle.  I cemented in the components and removed all excess cement.  The polyethylene tibial component was snapped into place and the knee placed in extension while cement was hardening.  The capsule was infilltrated with 30cc of .25% Marcaine with epinephrine.  A hemovac was place in the joint exiting superolaterally.  A pain pump was place superomedially superficial to the arthrotomy.  Once the cement was hard, the tourniquet was let down.  Hemostasis was obtained.  The arthrotomy was closed with figure-8 #1 vicryl sutures.  The deep soft tissues were closed with #0 vicryls and the subcuticular layer closed with a running #2-0 vicryl.  The skin was reapproximated and closed with skin staples.  The wound was dressed with xeroform, 4 x4's, 2 ABD sponges, a single layer of webril and a TED stocking.   The patient was then awakened, extubated, and taken to the recovery room in stable condition.  BLOOD LOSS:  300cc DRAINS: 1 hemovac, 1 pain catheter COMPLICATIONS:  None.  PLAN OF CARE: Admit to inpatient   PATIENT DISPOSITION:  PACU - hemodynamically stable.   Delay start of Pharmacological VTE agent (>24hrs) due to surgical blood loss or  risk of bleeding:  not applicable  Please fax a copy of this op note to my office at [phone] (please only include page 1 and 2 of the Case Information op note)

## 2013-11-04 NOTE — H&P (Signed)
[name] MRN:  [id number] DOB/SEX:  September 28, 1950/female  CHIEF COMPLAINT:  Painful left Knee  HISTORY: Patient is a 63 y.o. female presented with a history of pain in the left knee. Onset of symptoms was gradual starting several years ago with gradually worsening course since that time. Prior procedures on the knee include arthroscopy. Patient has been treated conservatively with over-the-counter NSAIDs and activity modification. Patient currently rates pain in the knee at 10 out of 10 with activity. There is pain at night.  PAST MEDICAL HISTORY: There are no active problems to display for this patient.  Past Medical History  Diagnosis Date  . PONV (postoperative nausea and vomiting)   . Diabetes mellitus without complication     borderline diet control   . Asthma     as child  no problems since age [age]  . Hypothyroidism   . GERD (gastroesophageal reflux disease)   . H/O hiatal hernia   . Headache(784.0)     bulging disc in neck   . Cancer     hx thyroid cancer 2009   Past Surgical History  Procedure Laterality Date  . Tonsillectomy      1968  . Carpal tunnel release      1984 right  . Bunionectomy      1984  rt  foot  . Knee arthroscopy      left    1996  . Dilation and curettage of uterus  2004  . Abdominal hysterectomy  2005  . Cholecystectomy  2007  . Thyroid surgery    . Foot surgery      right 2010      MEDICATIONS:   Prescriptions prior to admission  Medication Sig Dispense Refill  . CALCIUM PO Take 1 tablet by mouth 3 (three) times daily with meals.      Marland Kitchen estradiol (CLIMARA - DOSED IN MG/24 HR) 0.1 mg/24hr patch Place 0.1 mg onto the skin every Sunday.      . estradiol (VIVELLE-DOT) 0.075 MG/24HR Place 1 patch onto the skin every Sunday.      . lansoprazole (PREVACID) 30 MG capsule Take 30 mg by mouth daily at 12 noon.      Marland Kitchen levothyroxine (SYNTHROID, LEVOTHROID) 200 MCG tablet Take 200 mcg by mouth daily before breakfast.      . levothyroxine  (SYNTHROID, LEVOTHROID) 25 MCG tablet Take 25 mcg by mouth daily before breakfast. Takes with 200 mcg Monday - Friday to equal 225 mcg.      . Multiple Vitamin (MULTIVITAMIN WITH MINERALS) TABS tablet Take 1 tablet by mouth daily.      . ranitidine (ZANTAC) 300 MG tablet Take 300 mg by mouth at bedtime.        ALLERGIES:   Allergies  Allergen Reactions  . Ciprofloxacin Anaphylaxis and Swelling  . Penicillins Other (See Comments)    REACTION: Unknown.  Childhood allergy.  . Tape     Plastic   / adhesive    REVIEW OF SYSTEMS:  Pertinent items are noted in HPI.   FAMILY HISTORY:  History reviewed. No pertinent family history.  SOCIAL HISTORY:   History  Substance Use Topics  . Smoking status: Former Research scientist (life sciences)  . Smokeless tobacco: Not on file  . Alcohol Use: No     EXAMINATION:  Vital signs in last 24 hours: Temp:  [97.7 F (36.5 C)] 97.7 F (36.5 C) (07/20 0551) Pulse Rate:  [75] 75 (07/20 0551) Resp:  [20] 20 (07/20 0551) BP: (168)/(94) 168/94  mmHg (07/20 0551) SpO2:  [98 %] 98 % (07/20 0551) Weight:  [92.987 kg (205 lb)] 92.987 kg (205 lb) (07/20 0551)  General appearance: alert, cooperative and no distress Lungs: clear to auscultation bilaterally Heart: regular rate and rhythm, S1, S2 normal, no murmur, click, rub or gallop Abdomen: soft, non-tender; bowel sounds normal; no masses,  no organomegaly Extremities: extremities normal, atraumatic, no cyanosis or edema and Homans sign is negative, no sign of DVT Pulses: 2+ and symmetric Skin: Skin color, texture, turgor normal. No rashes or lesions Neurologic: Alert and oriented X 3, normal strength and tone. Normal symmetric reflexes. Normal coordination and gait  Musculoskeletal:  ROM 0-115, Ligaments intact,  Imaging Review Plain radiographs demonstrate severe degenerative joint disease of the left knee. The overall alignment is significant valgus. The bone quality appears to be good for age and reported activity  level.  Assessment/Plan: End stage arthritis, left knee   The patient history, physical examination and imaging studies are consistent with advanced degenerative joint disease of the left knee. The patient has failed conservative treatment.  The clearance notes were reviewed.  After discussion with the patient it was felt that Total Knee Replacement was indicated. The procedure,  risks, and benefits of total knee arthroplasty were presented and reviewed. The risks including but not limited to aseptic loosening, infection, blood clots, vascular injury, stiffness, patella tracking problems complications among others were discussed. The patient acknowledged the explanation, agreed to proceed with the plan.  Korby Ratay 11/04/2013, 6:48 AM

## 2013-11-04 NOTE — Anesthesia Postprocedure Evaluation (Signed)
  Anesthesia Post-op Note  Patient: [name]  Procedure(s) Performed: Procedure(s): TOTAL KNEE ARTHROPLASTY (Left)  Patient Location: PACU  Anesthesia Type:General and GA combined with regional for post-op pain  Level of Consciousness: awake, alert  and oriented  Airway and Oxygen Therapy: Patient Spontanous Breathing and Patient connected to nasal cannula oxygen  Post-op Pain: mild  Post-op Assessment: Post-op Vital signs reviewed, Patient's Cardiovascular Status Stable, Respiratory Function Stable, Patent Airway and Pain level controlled  Post-op Vital Signs: stable  Last Vitals:  Filed Vitals:   11/04/13 1150  BP: 133/70  Pulse: 72  Temp: 36.4 C  Resp: 16    Complications: No apparent anesthesia complications

## 2013-11-04 NOTE — Transfer of Care (Signed)
Immediate Anesthesia Transfer of Care Note  Patient: [name]  Procedure(s) Performed: Procedure(s): TOTAL KNEE ARTHROPLASTY (Left)  Patient Location: PACU  Anesthesia Type:General  Level of Consciousness: awake, alert , oriented and patient cooperative  Airway & Oxygen Therapy: Patient Spontanous Breathing and Patient connected to nasal cannula oxygen  Post-op Assessment: Report given to PACU RN and Post -op Vital signs reviewed and stable  Post vital signs: Reviewed  Complications: No apparent anesthesia complications

## 2013-11-04 NOTE — Anesthesia Preprocedure Evaluation (Addendum)
Anesthesia Evaluation  Patient identified by MRN, date of birth, ID band Patient awake    Reviewed: Allergy & Precautions, H&P , NPO status , Patient's Chart, lab work & pertinent test results  History of Anesthesia Complications (+) PONV and history of anesthetic complications  Airway Mallampati: II TM Distance: >3 FB Neck ROM: Full    Dental  (+) Teeth Intact, Dental Advisory Given   Pulmonary former smoker,  breath sounds clear to auscultation        Cardiovascular negative cardio ROS  Rhythm:Regular Rate:Normal     Neuro/Psych  Headaches, negative psych ROS   GI/Hepatic Neg liver ROS, hiatal hernia, GERD-  Medicated and Controlled,  Endo/Other  diabetesHypothyroidism Morbid obesityPre-diabetic CBG = 90  Renal/GU negative Renal ROS     Musculoskeletal negative musculoskeletal ROS (+)   Abdominal   Peds  Hematology   Anesthesia Other Findings   Reproductive/Obstetrics negative OB ROS                         Anesthesia Physical Anesthesia Plan  ASA: II  Anesthesia Plan: General   Post-op Pain Management:    Induction: Intravenous  Airway Management Planned: LMA  Additional Equipment:   Intra-op Plan:   Post-operative Plan:   Informed Consent: I have reviewed the patients History and Physical, chart, labs and discussed the procedure including the risks, benefits and alternatives for the proposed anesthesia with the patient or authorized representative who has indicated his/her understanding and acceptance.   Dental advisory given  Plan Discussed with: CRNA and Anesthesiologist  Anesthesia Plan Comments: (DJD L. Knee H/O PONV Borderline DM glucose 90  Plan GA with LMA and Adductor canal block)        Anesthesia Quick Evaluation

## 2013-11-04 NOTE — Anesthesia Procedure Notes (Addendum)
Anesthesia Regional Block:  Adductor canal block  Pre-Anesthetic Checklist: ,, timeout performed, Correct Patient, Correct Site, Correct Laterality, Correct Procedure, Correct Position, site marked, Risks and benefits discussed,  Surgical consent,  Pre-op evaluation,  At surgeon's request and post-op pain management  Laterality: Left  Prep: chloraprep       Needles:  Injection technique: Single-shot  Needle Type: Echogenic Stimulator Needle     Needle Length: 9cm 9 cm Needle Gauge: 22 and 22 G    Additional Needles:  Procedures: ultrasound guided (picture in chart) Adductor canal block Narrative:  Start time: 11/04/2013 7:00 AM End time: 11/04/2013 7:05 AM Injection made incrementally with aspirations every 5 mL.  Performed by: Personally   Additional Notes: 25 cc 0.5% marcaine with 1:200 Epi injected easily   Procedure Name: LMA Insertion Date/Time: 11/04/2013 7:44 AM Performed by: Luciana Axe K Pre-anesthesia Checklist: Patient identified, Emergency Drugs available, Suction available, Patient being monitored and Timeout performed Patient Re-evaluated:Patient Re-evaluated prior to inductionOxygen Delivery Method: Circle system utilized Preoxygenation: Pre-oxygenation with 100% oxygen Intubation Type: IV induction Ventilation: Mask ventilation without difficulty LMA: LMA inserted LMA Size: 4.0 Number of attempts: 1 Placement Confirmation: positive ETCO2,  CO2 detector and breath sounds checked- equal and bilateral Tube secured with: Tape Dental Injury: Teeth and Oropharynx as per pre-operative assessment

## 2013-11-04 NOTE — Progress Notes (Signed)
Orthopedic Tech Progress Note Patient Details:  [name] 22-Nov-1950 [id number] CPM applied to LLE with appropriate settings. OHF applied to bed. Footsie roll provided. CPM Left Knee CPM Left Knee: On Left Knee Flexion (Degrees): 90 Left Knee Extension (Degrees): 0   Asia R Thompson 11/04/2013, 10:09 AM

## 2013-11-04 NOTE — Progress Notes (Signed)
Report given to Celine RN 

## 2013-11-04 NOTE — Progress Notes (Signed)
Utilization review completed.  

## 2013-11-05 ENCOUNTER — Encounter (HOSPITAL_COMMUNITY): Payer: Self-pay | Admitting: Orthopedic Surgery

## 2013-11-05 LAB — BASIC METABOLIC PANEL
ANION GAP: 14 (ref 5–15)
BUN: 7 mg/dL (ref 6–23)
CHLORIDE: 99 meq/L (ref 96–112)
CO2: 24 mEq/L (ref 19–32)
CREATININE: 0.44 mg/dL — AB (ref 0.50–1.10)
Calcium: 7 mg/dL — ABNORMAL LOW (ref 8.4–10.5)
Glucose, Bld: 104 mg/dL — ABNORMAL HIGH (ref 70–99)
POTASSIUM: 3.8 meq/L (ref 3.7–5.3)
Sodium: 137 mEq/L (ref 137–147)

## 2013-11-05 LAB — GLUCOSE, CAPILLARY
GLUCOSE-CAPILLARY: 115 mg/dL — AB (ref 70–99)
Glucose-Capillary: 94 mg/dL (ref 70–99)

## 2013-11-05 LAB — CBC
HCT: 33.4 % — ABNORMAL LOW (ref 36.0–46.0)
HEMOGLOBIN: 11.2 g/dL — AB (ref 12.0–15.0)
MCH: 29.9 pg (ref 26.0–34.0)
MCHC: 33.5 g/dL (ref 30.0–36.0)
MCV: 89.3 fL (ref 78.0–100.0)
Platelets: 283 10*3/uL (ref 150–400)
RBC: 3.74 MIL/uL — ABNORMAL LOW (ref 3.87–5.11)
RDW: 12.8 % (ref 11.5–15.5)
WBC: 11.1 10*3/uL — ABNORMAL HIGH (ref 4.0–10.5)

## 2013-11-05 MED ORDER — OXYCODONE HCL 5 MG PO TABS: 5.0000 mg | ORAL_TABLET | ORAL | Status: DC | PRN

## 2013-11-05 MED ORDER — METHOCARBAMOL 500 MG PO TABS: 500.0000 mg | ORAL_TABLET | Freq: Four times a day (QID) | ORAL | Status: DC | PRN

## 2013-11-05 MED ORDER — OXYCODONE HCL ER 10 MG PO T12A: 10.0000 mg | EXTENDED_RELEASE_TABLET | Freq: Two times a day (BID) | ORAL | Status: DC

## 2013-11-05 MED ORDER — ENOXAPARIN SODIUM 40 MG/0.4ML ~~LOC~~ SOLN: 40.0000 mg | SUBCUTANEOUS | Status: DC

## 2013-11-05 NOTE — Progress Notes (Signed)
Physical Therapy Treatment Patient Details Name: [name] MRN: [id number] DOB: Aug 23, 1950 Today's Date: 11/05/2013    History of Present Illness Patient is a 63 yo female admitted 11/04/13 now s/p Lt TKA.  PMH: DM, asthma    PT Comments    Patient progressing well and able to complete stair training.   Follow Up Recommendations  Home health PT;Supervision/Assistance - 24 hour     Equipment Recommendations  None recommended by PT    Recommendations for Other Services       Precautions / Restrictions Precautions Precautions: Knee Precaution Comments: Reviewed precautions with pt Restrictions LLE Weight Bearing: Weight bearing as tolerated    Mobility  Bed Mobility               General bed mobility comments: pt in recliner before and after session.  Transfers Overall transfer level: Modified independent Equipment used: Rolling walker (2 wheeled) Transfers: Sit to/from Stand Sit to Stand: Supervision         General transfer comment: supervision for safety and cues for hand placement.   Ambulation/Gait Ambulation/Gait assistance: Modified independent (Device/Increase time) Ambulation Distance (Feet): 300 Feet Assistive device: Rolling walker (2 wheeled) Gait Pattern/deviations: Step-through pattern Gait velocity: decreased Gait velocity interpretation: Below normal speed for age/gender General Gait Details: Pt started amb with a step-through pattern. Gait velocity is increasing but still below normal speed.   Stairs Stairs: Yes Stairs assistance: Min assist Stair Management: One rail Left;No rails;Step to pattern;Backwards;Sideways Number of Stairs: 9 General stair comments: 3 steps backwards with min A to hold RW and cues for technique. 6 steps with L rail, cues for technique  Wheelchair Mobility    Modified Rankin (Stroke Patients Only)       Balance                                    Cognition Arousal/Alertness:  Awake/alert Behavior During Therapy: WFL for tasks assessed/performed Overall Cognitive Status: Within Functional Limits for tasks assessed                      Exercises Total Joint Exercises Ankle Circles/Pumps: AROM;20 reps;Seated Quad Sets: AROM;Left;Seated;10 reps Heel Slides: AAROM;5 reps;Seated;Left Hip ABduction/ADduction: AROM;Seated;10 reps;Left Straight Leg Raises: AAROM;Left;10 reps;Seated Long Arc Quad: AROM;Left;10 reps;Seated    General Comments        Pertinent Vitals/Pain no apparent distress     Home Living                      Prior Function            PT Goals (current goals can now be found in the care plan section) Progress towards PT goals: Progressing toward goals    Frequency  7X/week    PT Plan Current plan remains appropriate    Co-evaluation             End of Session Equipment Utilized During Treatment: Gait belt Activity Tolerance: Patient tolerated treatment well Patient left: in chair;with call bell/phone within reach;with family/visitor present     Time: 9735-3299 PT Time Calculation (min): 13 min  Charges:  $Gait Training: 8-22 mins $Therapeutic Exercise: 8-22 mins                    G Codes:      Jacqualyn Posey 11/05/2013, 3:29 PM  11/05/2013 Ally Knodel, Tonia Brooms  PTA [phone] pager [phone] office

## 2013-11-05 NOTE — Evaluation (Signed)
Occupational Therapy Evaluation Patient Details Name: [name] MRN: [id number] DOB: [birth year] Today's Date: 11/05/2013    History of Present Illness Patient is a 63 yo female admitted 11/04/13 now s/p Lt TKA.  PMH: DM, asthma   Clinical Impression   Pt s/p above. Education provided to pt and spouse. No further OT needs at this time and pt verbalized understanding.    Follow Up Recommendations  No OT follow up;Supervision - Intermittent    Equipment Recommendations  None recommended by OT    Recommendations for Other Services       Precautions / Restrictions Precautions Precautions: Knee;Fall Precaution Comments: Reviewed precautions with pt Restrictions Weight Bearing Restrictions: Yes LLE Weight Bearing: Weight bearing as tolerated      Mobility Bed Mobility Overal bed mobility: Modified Independent             General bed mobility comments: Mod I for supine to sit  Transfers Overall transfer level: Needs assistance Equipment used: Rolling walker (2 wheeled) Transfers: Sit to/from Stand Sit to Stand: Min guard         General transfer comment: Cues for technique/hand placement.    Balance                                            ADL Overall ADL's : Needs assistance/impaired     Grooming: Wash/dry face;Oral care;Set up;Supervision/safety;Standing               Lower Body Dressing: Min guard;Sit to/from stand   Toilet Transfer: Min guard;Ambulation (3 in 1 over commode)   Toileting- Clothing Manipulation and Hygiene: Min guard (standing/sitting)       Functional mobility during ADLs: Min guard;Rolling walker General ADL Comments: Educated on dressing technique. Educated on safety tips for home (safe shoewear, use of bag on walker, sitting for most of LB ADLs). Explained use of reacher if needed. Educated on tub transfer techniques and options for position of 3 in 1. Recommended pt not step over tub for a while and  recommended spouse be with her. Pt says she plans to sponge bathe for a while, but did want to know how to perform transfer when she does do so.     Vision                     Perception     Praxis      Pertinent Vitals/Pain Pain 3-4/10. Positioned leg in footsie and educated on purpose.      Hand Dominance     Extremity/Trunk Assessment Upper Extremity Assessment Upper Extremity Assessment: Overall WFL for tasks assessed   Lower Extremity Assessment Lower Extremity Assessment: Defer to PT evaluation       Communication Communication Communication: No difficulties   Cognition Arousal/Alertness: Awake/alert Behavior During Therapy: WFL for tasks assessed/performed Overall Cognitive Status: Within Functional Limits for tasks assessed                     General Comments       Exercises       Shoulder Instructions      Home Living Family/patient expects to be discharged to:: Private residence Living Arrangements: Spouse/significant other Available Help at Discharge: Family (worked out assistance until Sunday) Type of Home: House Home Access: Stairs to enter [employer] of Steps: 3 Entrance Stairs-Rails: None Home Layout:  Two level;Bed/bath upstairs;1/2 bath on main level Alternate Level Stairs-Number of Steps: 14 Alternate Level Stairs-Rails: Right Bathroom Shower/Tub: Tub/shower unit Shower/tub characteristics: Curtain Biochemist, clinical: Standard     Home Equipment: Environmental consultant - 2 wheels;Bedside commode          Prior Functioning/Environment Level of Independence: Independent             OT Diagnosis:     OT Problem List:     OT Treatment/Interventions:      OT Goals(Current goals can be found in the care plan section)    OT Frequency:     Barriers to D/C:            Co-evaluation              End of Session Equipment Utilized During Treatment: Gait belt;Rolling walker Nurse Communication: Other (comment)  (left hand swollen)  Activity Tolerance: Patient tolerated treatment well Patient left: in chair;with call bell/phone within reach;with family/visitor present   Time: 2952-8413 OT Time Calculation (min): 22 min Charges:  OT General Charges $OT Visit: 1 Procedure OT Evaluation $Initial OT Evaluation Tier I: 1 Procedure OT Treatments $Self Care/Home Management : 8-22 mins G-CodesBenito Mccreedy OTR/L [phone] 11/05/2013, 10:27 AM

## 2013-11-05 NOTE — Discharge Instructions (Signed)
Diet: As you were doing prior to hospitalization   Activity:  Increase activity slowly as tolerated                  No lifting or driving for 6 weeks  Shower:  May shower without a dressing once there is no drainage from your wound.                 Do NOT wash over the wound.                 Dressing:  You may change your dressing on Wednesday                    Then change the dressing daily with sterile 4"x4"s gauze dressing                     And TED hose for knees.  Weight Bearing:  Weight bearing as tolerated as taught in physical therapy.  Use a                                walker or Crutches as instructed.  To prevent constipation: you may use a stool softener such as -               Colace ( over the counter) 100 mg by mouth twice a day                Drink plenty of fluids ( prune juice may be helpful) and high fiber foods                Miralax ( over the counter) for constipation as needed.    Precautions:  If you experience chest pain or shortness of breath - call 911 immediately               For transfer to the hospital emergency department!!               If you develop a fever greater that 101 F, purulent drainage from wound,                             increased redness or drainage from wound, or calf pain -- Call the office.  Follow- Up Appointment:  Please call for an appointment to be seen on 11/19/13                                              [facility] office:  [phone]            [address] [place], [place] [address]

## 2013-11-05 NOTE — Progress Notes (Signed)
SPORTS MEDICINE AND JOINT REPLACEMENT  Lara Mulch, MD   Carlynn Spry, PA-C [place], [place], [place]  [address]                             [phone]   PROGRESS NOTE  Subjective:  negative for Chest Pain  negative for Shortness of Breath  negative for Nausea/Vomiting   negative for Calf Pain  negative for Bowel Movement   Tolerating Diet: yes         Patient reports pain as 3 on 0-10 scale.    Objective: Vital signs in last 24 hours:   Patient Vitals for the past 24 hrs:  BP Temp Temp src Pulse Resp SpO2  11/05/13 0543 108/53 mmHg 97.7 F (36.5 C) - 66 16 97 %  11/05/13 0400 - - - - 16 99 %  11/05/13 0130 107/55 mmHg 97.9 F (36.6 C) Oral 68 16 95 %  11/05/13 0000 - - - - 16 99 %  11/04/13 2000 - - - - 16 98 %  11/04/13 1900 107/58 mmHg 97.4 F (36.3 C) Oral 71 14 96 %  11/04/13 1250 136/78 mmHg 97.4 F (36.3 C) Oral 72 16 96 %    @flow {1959:LAST@   Intake/Output from previous day:   07/20 0701 - 07/21 0700 In: 3567.5 [P.O.:780; I.V.:2787.5] Out: 460 [Drains:385]   Intake/Output this shift:       Intake/Output     07/20 0701 - 07/21 0700 07/21 0701 - 07/22 0700   P.O. 780    I.V. (mL/kg) 2787.5 (30)    Total Intake(mL/kg) 3567.5 (38.4)    Drains 385    Blood 75    Total Output 460     Net +3107.5          Urine Occurrence 1 x       LABORATORY DATA:  Recent Labs  11/04/13 1225 11/05/13 0513  WBC 10.0 11.1*  HGB 13.2 11.2*  HCT 40.1 33.4*  PLT 287 283    Recent Labs  11/04/13 1225 11/05/13 0513  NA  --  137  K  --  3.8  CL  --  99  CO2  --  24  BUN  --  7  CREATININE 0.43* 0.44*  GLUCOSE  --  104*  CALCIUM  --  7.0*   Lab Results  Component Value Date   INR 1.02 10/24/2013   INR 0.9 11/19/2007    Examination:  General appearance: alert, cooperative and no distress Extremities: Homans sign is negative, no sign of DVT  Wound Exam: clean, dry, intact   Drainage:  None: wound tissue dry  Motor Exam: EHL and FHL  Intact  Sensory Exam: Deep Peroneal normal   Assessment:    1 Day Post-Op  Procedure(s) (LRB): TOTAL KNEE ARTHROPLASTY (Left)  ADDITIONAL DIAGNOSIS:  Active Problems:   S/P total knee replacement using cement  Acute Blood Loss Anemia   Plan: Physical Therapy as ordered Weight Bearing as Tolerated (WBAT)  DVT Prophylaxis:  Lovenox  DISCHARGE PLAN: Home  DISCHARGE NEEDS: HHPT, CPM, Walker and 3-in-1 comode seat         Claudell Rhody 11/05/2013, 11:59 AM

## 2013-11-05 NOTE — Progress Notes (Signed)
Seen and agreed 11/05/2013 Jacqualyn Posey PTA [phone] pager [phone] office

## 2013-11-05 NOTE — Progress Notes (Signed)
Physical Therapy Treatment Patient Details Name: [name] MRN: [id number] DOB: 11-13-1950 Today's Date: 11/05/2013    History of Present Illness Patient is a 63 yo female admitted 11/04/13 now s/p Lt TKA.  PMH: DM, asthma    PT Comments    Pt was very motivated to get up and walk and perform exercises. Pt was able to amb much further than yesterday without any nausea. Planning to practice stairs next session.   Follow Up Recommendations  Home health PT;Supervision/Assistance - 24 hour     Equipment Recommendations  None recommended by PT    Recommendations for Other Services       Precautions / Restrictions Precautions Precautions: Knee;Fall Precaution Comments: Reviewed precautions with pt Restrictions Weight Bearing Restrictions: Yes LLE Weight Bearing: Weight bearing as tolerated    Mobility  Bed Mobility Overal bed mobility: Modified Independent             General bed mobility comments: pt in recliner before and after session.  Transfers Overall transfer level: Needs assistance Equipment used: Rolling walker (2 wheeled) Transfers: Sit to/from Stand Sit to Stand: Supervision         General transfer comment: supervision for safety and cues for hand placement.   Ambulation/Gait Ambulation/Gait assistance: Supervision Ambulation Distance (Feet): 200 Feet Assistive device: Rolling walker (2 wheeled) Gait Pattern/deviations: Step-through pattern;Decreased stride length Gait velocity: decreased Gait velocity interpretation: Below normal speed for age/gender General Gait Details: Pt started amb with a step-through pattern. Gait velocity is increasing but still below normal speed.   Stairs            Wheelchair Mobility    Modified Rankin (Stroke Patients Only)       Balance                                    Cognition Arousal/Alertness: Awake/alert Behavior During Therapy: WFL for tasks assessed/performed Overall  Cognitive Status: Within Functional Limits for tasks assessed                      Exercises Total Joint Exercises Ankle Circles/Pumps: AROM;20 reps;Seated Quad Sets: AROM;Left;Seated;10 reps Heel Slides: AAROM;5 reps;Seated;Left Hip ABduction/ADduction: AROM;Seated;10 reps;Left Straight Leg Raises: AAROM;Left;10 reps;Seated Long Arc Quad: AROM;Left;10 reps;Seated    General Comments        Pertinent Vitals/Pain no apparent distress. Pt repositioned in recliner for comfort in footsie roll.      Home Living Family/patient expects to be discharged to:: Private residence Living Arrangements: Spouse/significant other Available Help at Discharge: Family (worked out assistance until Sunday) Type of Home: House Home Access: Stairs to enter Entrance Stairs-Rails: None Home Layout: Two level;Bed/bath upstairs;1/2 bath on main level [place]: [place] - 2 wheels;Bedside commode      Prior Function Level of Independence: Independent          PT Goals (current goals can now be found in the care plan section) Progress towards PT goals: Progressing toward goals    Frequency  7X/week    PT Plan Current plan remains appropriate    Co-evaluation             End of Session Equipment Utilized During Treatment: Gait belt Activity Tolerance: Patient tolerated treatment well Patient left: in chair;with family/visitor present;with call bell/phone within reach     Time: 5809-9833 PT Time Calculation (min): 25 min  Charges:  G Codes:      BRASFIELD,Oseias Horsey,SPTA 11/05/2013, 11:52 AM

## 2013-11-14 NOTE — Discharge Summary (Signed)
SPORTS MEDICINE & JOINT REPLACEMENT   Stephanie Mulch, MD   Stephanie Spry, PA-C [name], [name], [name]  [id number]                             [phone]  PATIENT ID: [name]        MRN:  [id number]          DOB/AGE: [birth year] / 63 y.o.    DISCHARGE SUMMARY  ADMISSION DATE:    11/04/2013 DISCHARGE DATE:  11/05/2013   ADMISSION DIAGNOSIS: osteoarthritis left knee    DISCHARGE DIAGNOSIS:  osteoarthritis left knee    ADDITIONAL DIAGNOSIS: Active Problems:   S/P total knee replacement using cement  Past Medical History  Diagnosis Date  . PONV (postoperative nausea and vomiting)   . Diabetes mellitus without complication     borderline diet control   . Asthma     as child  no problems since age 34/16  . Hypothyroidism   . GERD (gastroesophageal reflux disease)   . H/O hiatal hernia   . Headache(784.0)     bulging disc in neck   . Cancer     hx thyroid cancer 2009    PROCEDURE: Procedure(s): TOTAL KNEE ARTHROPLASTY on 11/04/2013  CONSULTS:     HISTORY: See H&P in chart  HOSPITAL COURSE:  [name] is a 63 y.o. admitted on 11/04/2013 and found to have a diagnosis of osteoarthritis left knee.  After appropriate laboratory studies were obtained  they were taken to the operating room on 11/04/2013 and underwent Procedure(s): TOTAL KNEE ARTHROPLASTY.   They were given perioperative antibiotics:  Anti-infectives   Start     Dose/Rate Route Frequency Ordered Stop   11/04/13 1900  vancomycin (VANCOCIN) IVPB 1000 mg/200 mL premix     1,000 mg 200 mL/hr over 60 Minutes Intravenous Every 12 hours 11/04/13 1100 11/04/13 2036   11/04/13 0600  vancomycin (VANCOCIN) IVPB 1000 mg/200 mL premix     1,000 mg 200 mL/hr over 60 Minutes Intravenous On call to O.R. 11/03/13 1348 11/04/13 0810    .  Tolerated the procedure well.  Placed with a foley intraoperatively.  Given Ofirmev at induction and for 48 hours.    POD# 1: Vital signs were stable.  Patient  denied Chest pain, shortness of breath, or calf pain.  Patient was started on Lovenox 30 mg subcutaneously twice daily at 8am.  Consults to PT, OT, and care management were made.  The patient was weight bearing as tolerated.  CPM was placed on the operative leg 0-90 degrees for 6-8 hours a day.  Incentive spirometry was taught.  Dressing was changed.  Hemovac was discontinued.      POD #2, Continued  PT for ambulation and exercise program.  IV saline locked.  O2 discontinued.    The remainder of the hospital course was dedicated to ambulation and strengthening.   The patient was discharged 1 day post op in  Good condition.  Blood products given:none  DIAGNOSTIC STUDIES: Recent vital signs: No data found.      Recent laboratory studies: No results found for this basename: WBC, HGB, HCT, PLT,  in the last 168 hours No results found for this basename: NA, K, CL, CO2, BUN, CREATININE, GLUCOSE, CALCIUM,  in the last 168 hours Lab Results  Component Value Date   INR 1.02 10/24/2013   INR 0.9 11/19/2007     Recent Radiographic  Studies :  Dg Chest 2 View  10/24/2013   CLINICAL DATA:  Preoperative respiratory evaluation prior to left total knee arthroplasty. Current history of diabetes and asthma.  EXAM: CHEST  2 VIEW  COMPARISON:  11/19/2007, 03/31/2005.  FINDINGS: Suboptimal inspiration accounts for crowded bronchovascular markings, especially in the bases, and accentuates the cardiac silhouette. Taking this into account, cardiomediastinal silhouette unremarkable and unchanged. Lungs clear. Bronchovascular markings normal. Pulmonary vascularity normal. No visible pleural effusions. No pneumothorax. Mild degenerative changes involving the thoracic spine. No significant interval change.  IMPRESSION: Suboptimal inspiration.  No acute cardiopulmonary disease.   Electronically Signed   By: Evangeline Dakin M.D.   On: 10/24/2013 09:15    DISCHARGE INSTRUCTIONS: Discharge Instructions   CPM    Complete  by:  As directed   Continuous passive motion machine (CPM):      Use the CPM from 0 to 90 for 6-8 hours per day.      You may increase by 10 per day.  You may break it up into 2 or 3 sessions per day.      Use CPM for 2 weeks or until you are told to stop.     Call MD / Call 911    Complete by:  As directed   If you experience chest pain or shortness of breath, CALL 911 and be transported to the hospital emergency room.  If you develope a fever above 101 F, pus (white drainage) or increased drainage or redness at the wound, or calf pain, call your surgeon's office.     Change dressing    Complete by:  As directed   Change dressing on Wednesday, then change the dressing daily with sterile 4 x 4 inch gauze dressing and apply TED hose.     Constipation Prevention    Complete by:  As directed   Drink plenty of fluids.  Prune juice may be helpful.  You may use a stool softener, such as Colace (over the counter) 100 mg twice a day.  Use MiraLax (over the counter) for constipation as needed.     Diet - low sodium heart healthy    Complete by:  As directed      Do not put a pillow under the knee. Place it under the heel.    Complete by:  As directed      Driving restrictions    Complete by:  As directed   No driving for 6 weeks     Increase activity slowly as tolerated    Complete by:  As directed      Lifting restrictions    Complete by:  As directed   No lifting for 6 weeks     TED hose    Complete by:  As directed   Use stockings (TED hose) for 2 weeks on both leg(s).  You may remove them at night for sleeping.           DISCHARGE MEDICATIONS:     Medication List         CALCIUM PO  Take 1 tablet by mouth 3 (three) times daily with meals.     enoxaparin 40 MG/0.4ML injection  Commonly known as:  LOVENOX  Inject 0.4 mLs (40 mg total) into the skin daily.     estradiol 0.1 mg/24hr patch  Commonly known as:  CLIMARA - Dosed in mg/24 hr  Place 0.1 mg onto the skin every Sunday.      estradiol 0.075 MG/24HR  Commonly known as:  VIVELLE-DOT  Place 1 patch onto the skin every Sunday.     lansoprazole 30 MG capsule  Commonly known as:  PREVACID  Take 30 mg by mouth daily at 12 noon.     levothyroxine 200 MCG tablet  Commonly known as:  SYNTHROID, LEVOTHROID  Take 200 mcg by mouth daily before breakfast.     levothyroxine 25 MCG tablet  Commonly known as:  SYNTHROID, LEVOTHROID  Take 25 mcg by mouth daily before breakfast. Takes with 200 mcg Monday - Friday to equal 225 mcg.     methocarbamol 500 MG tablet  Commonly known as:  ROBAXIN  Take 1-2 tablets (500-1,000 mg total) by mouth every 6 (six) hours as needed for muscle spasms.     multivitamin with minerals Tabs tablet  Take 1 tablet by mouth daily.     oxyCODONE 5 MG immediate release tablet  Commonly known as:  Oxy IR/ROXICODONE  Take 1-2 tablets (5-10 mg total) by mouth every 3 (three) hours as needed for breakthrough pain.     OxyCODONE 10 mg T12a 12 hr tablet  Commonly known as:  OXYCONTIN  Take 1 tablet (10 mg total) by mouth every 12 (twelve) hours.     ranitidine 300 MG tablet  Commonly known as:  ZANTAC  Take 300 mg by mouth at bedtime.        FOLLOW UP VISIT:       Follow-up Information   Follow up with Rudean Haskell, MD. Call on 11/19/2013.   Specialty:  Orthopedic Surgery   Contact information:   [address] [place] [address] [phone]       DISPOSITION: HOME  CONDITION:  Good   Ilina Xu 11/14/2013, 3:34 PM

## 2014-02-17 ENCOUNTER — Other Ambulatory Visit: Payer: Self-pay | Admitting: Obstetrics and Gynecology

## 2014-02-18 LAB — CYTOLOGY - PAP

## 2016-03-18 IMAGING — CR DG CHEST 2V
2 series · 2 of 2 positions shown · non-contrast
Comparison: 11/19/2007, 03/31/2005.

CLINICAL DATA: Preoperative respiratory evaluation prior to left
total knee arthroplasty. Current history of diabetes and asthma.

EXAM:
CHEST  2 VIEW

[w chest pa]
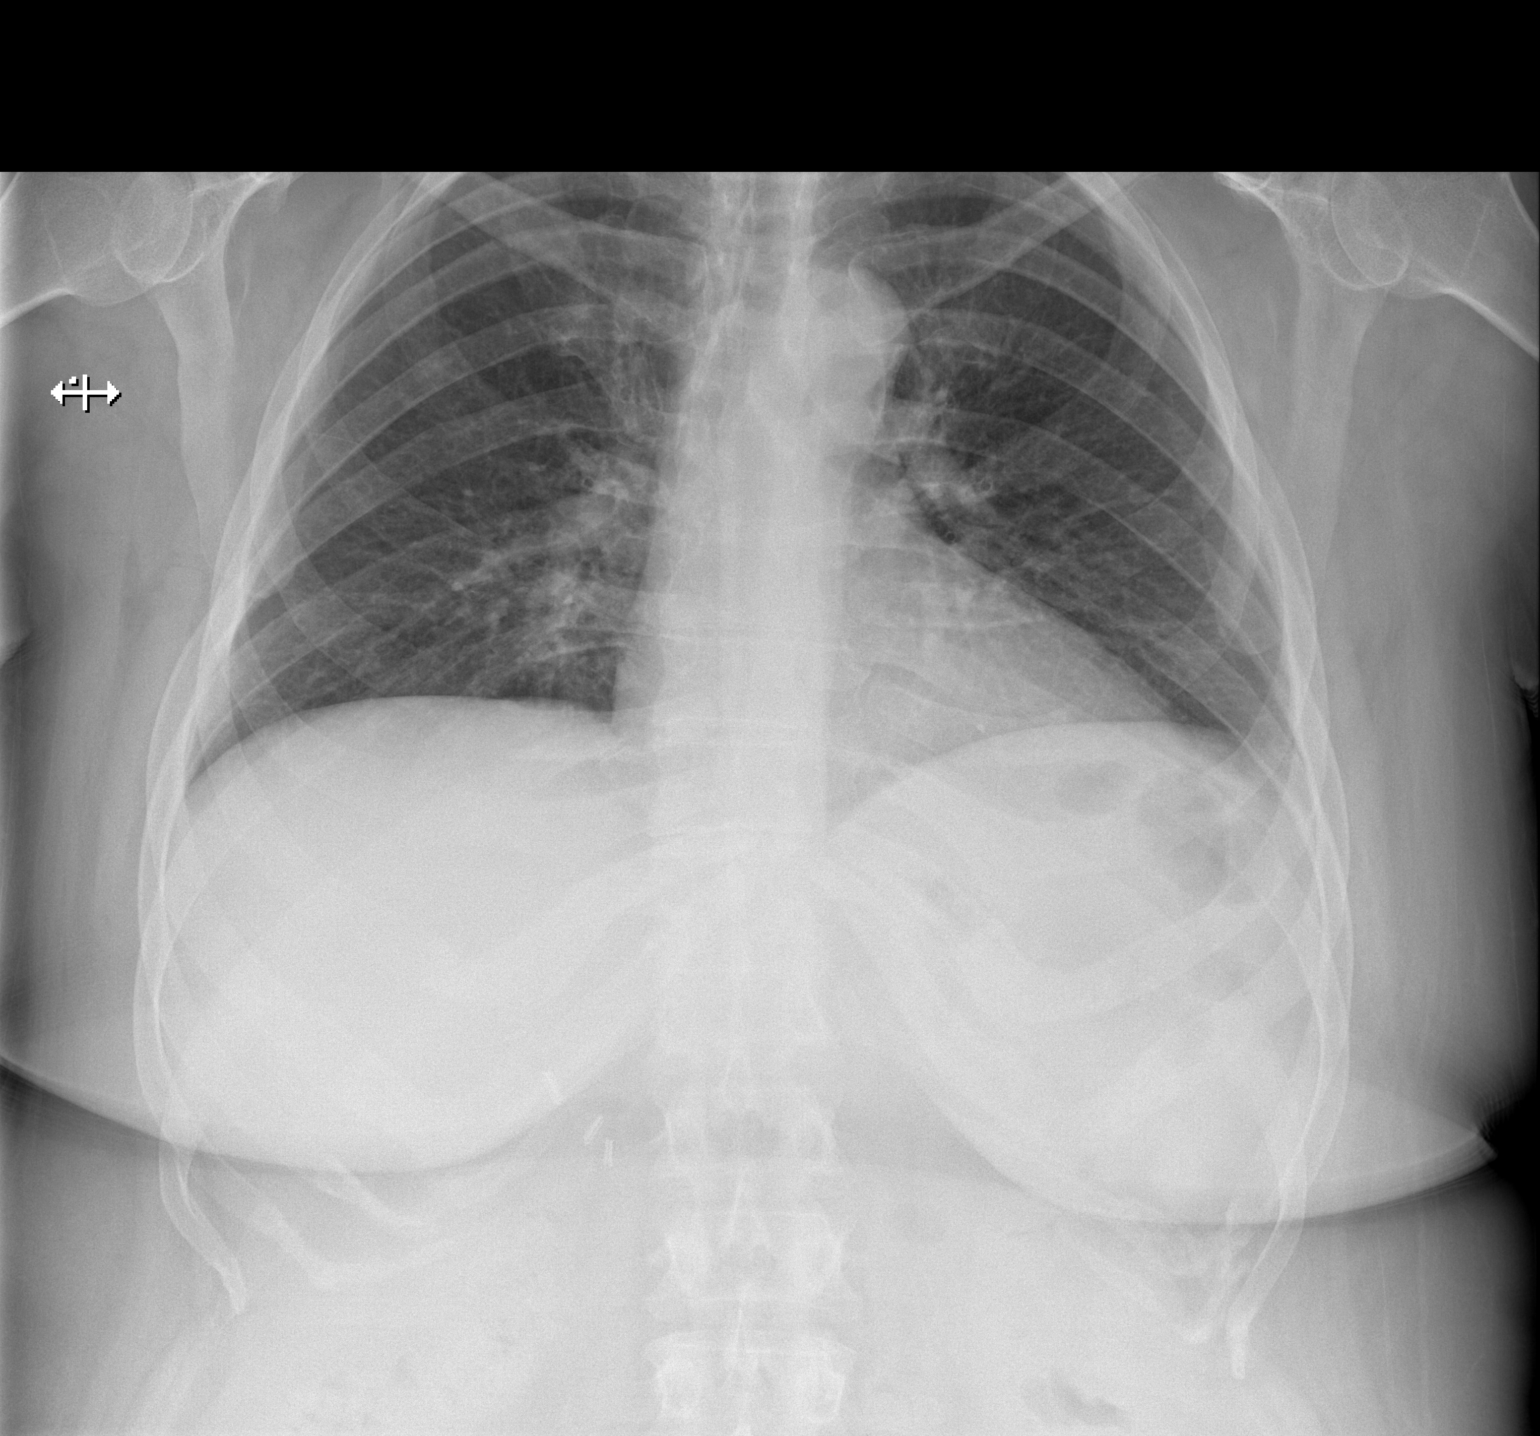

[w chest lat]
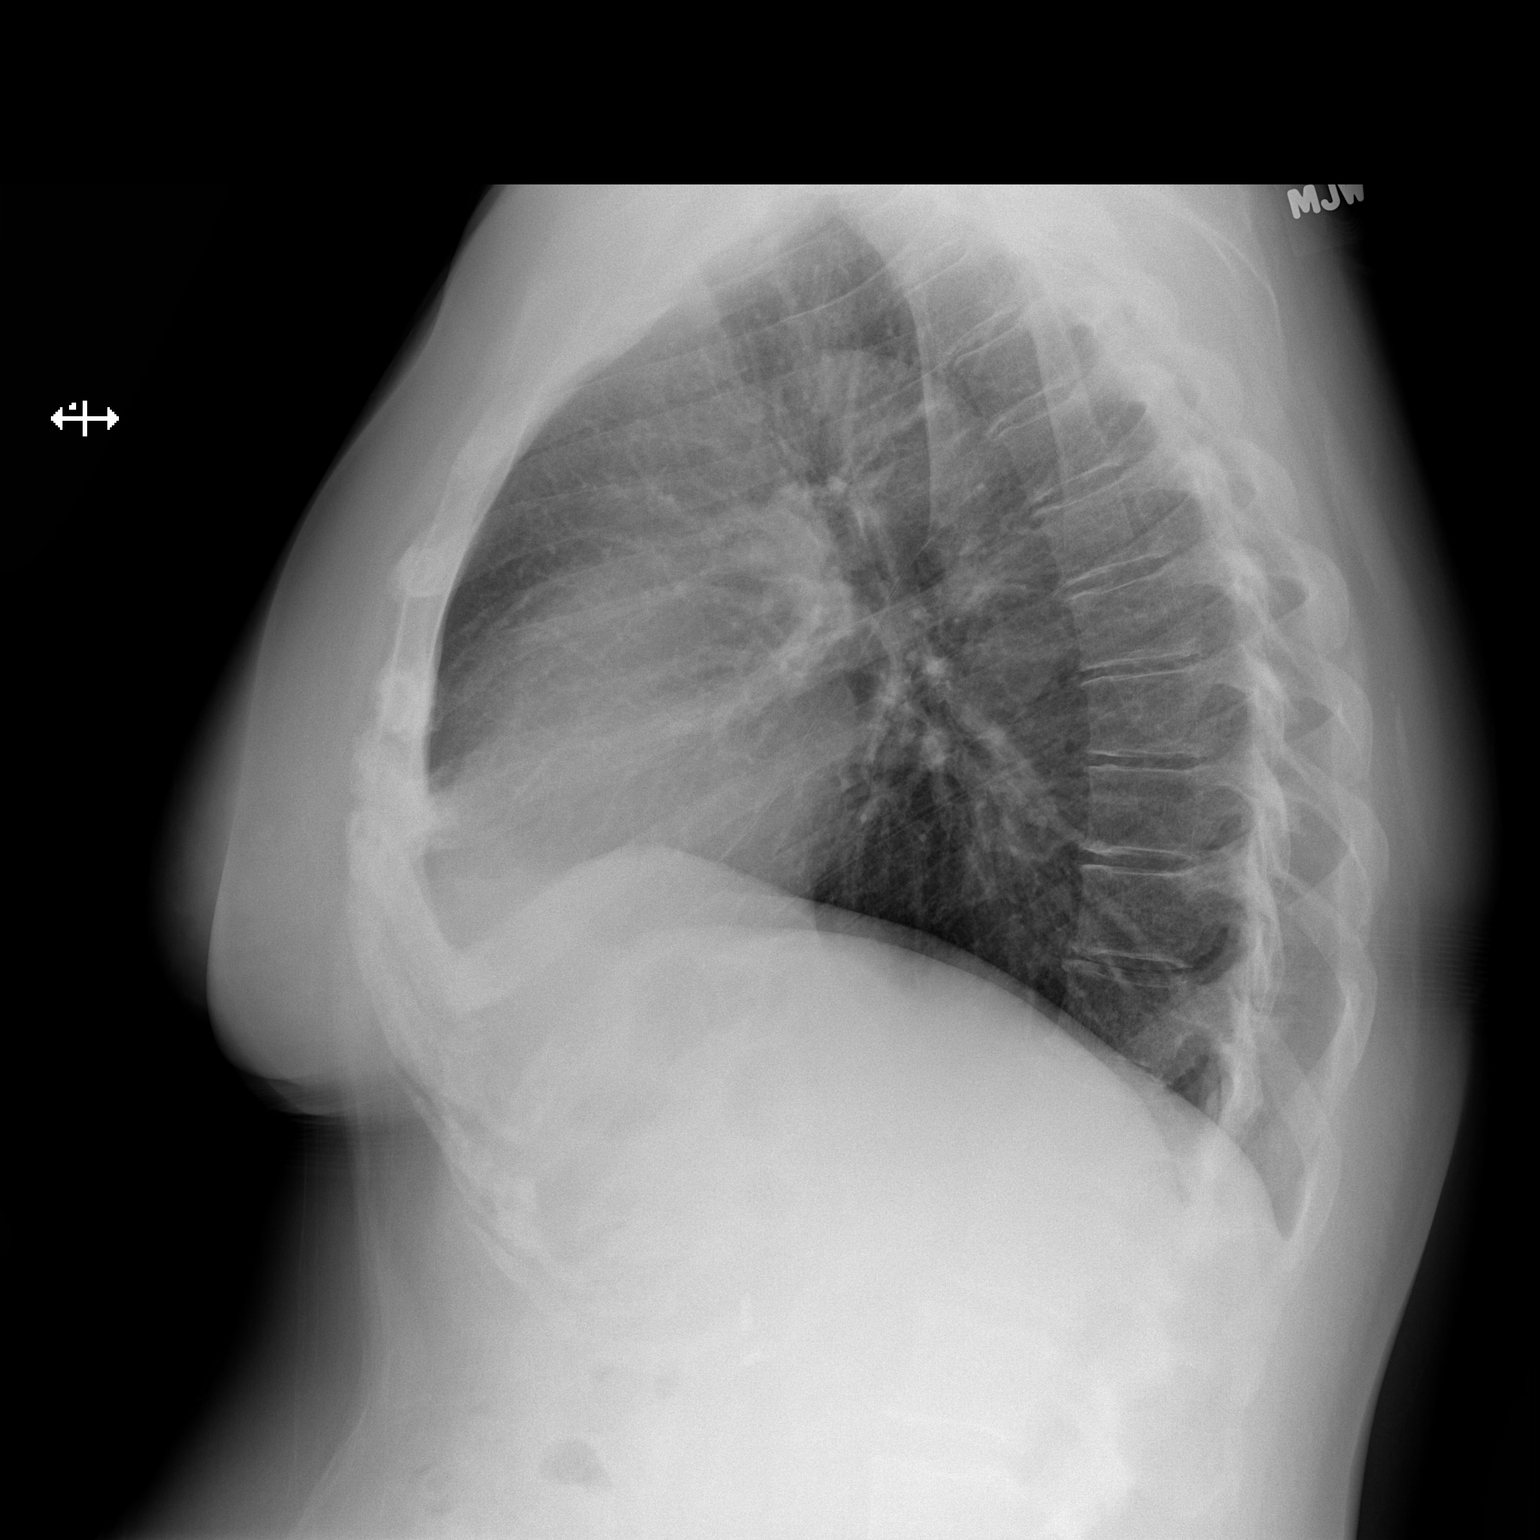

[2 of 2 positions shown; findings below may reference images not displayed]

FINDINGS: Suboptimal inspiration accounts for crowded bronchovascular
markings, especially in the bases, and accentuates the cardiac
silhouette. Taking this into account, cardiomediastinal silhouette
unremarkable and unchanged. Lungs clear. Bronchovascular markings
normal. Pulmonary vascularity normal. No visible pleural effusions.
No pneumothorax. Mild degenerative changes involving the thoracic
spine. No significant interval change.
IMPRESSION: Suboptimal inspiration.  No acute cardiopulmonary disease.

## 2017-04-18 DIAGNOSIS — R079 Chest pain, unspecified: Secondary | ICD-10-CM | POA: Diagnosis not present

## 2017-04-19 DIAGNOSIS — R079 Chest pain, unspecified: Secondary | ICD-10-CM | POA: Diagnosis not present

## 2017-04-24 ENCOUNTER — Other Ambulatory Visit: Payer: Self-pay

## 2017-04-24 NOTE — Patient Outreach (Signed)
[facility]) Care Management  04/24/2017  [name] Nov 19, 1950 [id number]   Referral Date: 04-24-17 Referral Source: HTA Date of Admission: 04-18-17 Diagnosis: Feeling sick, chest pain, sweating Date of Discharge: 04-19-17 Facility:  [facility]: HTA  Outreach attempt # 1  Social: Patient lives in the home with her spouse. Patient is independent with all aspects of care.     Conditions:  Patient reports she went into the hospital feeling sick , sweating, and some chest discomfort.  She reports that no problems were found on all her cardiac testing.  She states that her doctor seemed to think it was her GERD. So medication was increased.    Medications: Patient reports she is taking her medications as ordered.    Appointments: Patient reports she taking her medications as ordered and denies any problems.  Medications reviewed with patient.  Consent: RN CM reviewed [facility] services with patient. Patient declined services.  Plan: RN CM will send letter and brochure.  RN CM will close case and notify care management assistant of case status.     Jone Baseman, RN, MSN [facility] Care Management Care Management Coordinator Direct Line [phone] Toll Free: [phone]  Fax: [phone]

## 2017-05-16 DIAGNOSIS — J101 Influenza due to other identified influenza virus with other respiratory manifestations: Secondary | ICD-10-CM | POA: Diagnosis not present

## 2017-05-17 DIAGNOSIS — J101 Influenza due to other identified influenza virus with other respiratory manifestations: Secondary | ICD-10-CM | POA: Diagnosis not present

## 2017-05-18 DIAGNOSIS — J101 Influenza due to other identified influenza virus with other respiratory manifestations: Secondary | ICD-10-CM | POA: Diagnosis not present

## 2017-06-09 ENCOUNTER — Ambulatory Visit: Payer: PPO | Admitting: Cardiovascular Disease

## 2017-06-09 ENCOUNTER — Encounter: Payer: Self-pay | Admitting: Cardiovascular Disease

## 2017-06-09 DIAGNOSIS — R002 Palpitations: Secondary | ICD-10-CM

## 2017-06-09 DIAGNOSIS — R0789 Other chest pain: Secondary | ICD-10-CM

## 2017-06-09 NOTE — Assessment & Plan Note (Signed)
[name] sent to me by Dr. Helane Rima for palpitations and atypical chest pain. She's had palpitations now for several months. They occur while while she is exercising as well as at rest. I'm going to get a 30 day event monitor to further evaluate

## 2017-06-09 NOTE — Progress Notes (Signed)
06/09/2017 PEYTAN ANDRINGA   Jun 13, 1950  [id number]  Primary Physician Nicoletta Dress, MD Primary Cardiologist: Lorretta Harp MD Garret Reddish, [address], [place]  HPI:  [name] is a 67 y.o. mildly overweight married Caucasian female mother of no children accompanied by her husband Henrene Pastor today. Her cardiac risk factors include family history with a father who died of a myocardial infarction age 61. Otherwise she has no cardiac risk factors. She was referred by Dr. Helane Rima for cardiovascular evaluation because of palpitations and atypical chest pain. She was hospitalized on 04/18/17 with chest pain at [facility]. She ruled out for myocardial infarction. A CT scan showed no pulmonary emboli and a Cardiolite stress test was negative for ischemia. She does complain of occasional  Palpitations both at rest and with exercise. She exercises recently for the treadmill, elliptical and recumbent bike.   Current Meds  Medication Sig  . azelastine (ASTELIN) 0.1 % nasal spray Place 2 sprays into both nostrils daily as needed for rhinitis. Use in each nostril as directed  . calcitRIOL (ROCALTROL) 0.25 MCG capsule Take 0.25 mcg by mouth daily.  Marland Kitchen CALCIUM PO Take 1 tablet by mouth 3 (three) times daily with meals.  Marland Kitchen estradiol (CLIMARA - DOSED IN MG/24 HR) 0.1 mg/24hr patch Place 0.1 mg onto the skin every Sunday.  . estradiol (VIVELLE-DOT) 0.075 MG/24HR Place 1 patch onto the skin every Sunday.  . lansoprazole (PREVACID) 30 MG capsule Take 30 mg by mouth daily at 12 noon.  Marland Kitchen levothyroxine (SYNTHROID, LEVOTHROID) 112 MCG tablet Take 112 mcg by mouth 2 (two) times daily.  . Multiple Vitamin (MULTIVITAMIN WITH MINERALS) TABS tablet Take 1 tablet by mouth daily.  . ranitidine (ZANTAC) 300 MG tablet Take 300 mg by mouth at bedtime.     Allergies  Allergen Reactions  . Ciprofloxacin Anaphylaxis and Swelling  . Penicillins Other (See Comments)    REACTION: Unknown.  Childhood allergy.  . Tape    Plastic   / adhesive    Social History   Socioeconomic History  . Marital status: Married    Spouse name: Not on file  . Number of children: Not on file  . Years of education: Not on file  . Highest education level: Not on file  Social Needs  . Financial resource strain: Not on file  . Food insecurity - worry: Not on file  . Food insecurity - inability: Not on file  . Transportation needs - medical: Not on file  . Transportation needs - non-medical: Not on file  Occupational History  . Not on file  Tobacco Use  . Smoking status: Former Smoker    Types: Cigarettes    Last attempt to quit: 04/18/1977    Years since quitting: 40.1  . Smokeless tobacco: Never Used  Substance and Sexual Activity  . Alcohol use: No  . Drug use: No  . Sexual activity: Not on file  Other Topics Concern  . Not on file  Social History Narrative  . Not on file     Review of Systems: General: negative for chills, fever, night sweats or weight changes.  Cardiovascular: negative for chest pain, dyspnea on exertion, edema, orthopnea, palpitations, paroxysmal nocturnal dyspnea or shortness of breath Dermatological: negative for rash Respiratory: negative for cough or wheezing Urologic: negative for hematuria Abdominal: negative for nausea, vomiting, diarrhea, bright red blood per rectum, melena, or hematemesis Neurologic: negative for visual changes, syncope, or dizziness All other systems reviewed and are  otherwise negative except as noted above.    Blood pressure (!) 161/83, pulse 71, height 5\' 7"  (1.702 m), weight 207 lb (93.9 kg).  General appearance: alert and no distress Neck: no adenopathy, no carotid bruit, no JVD, supple, symmetrical, trachea midline and thyroid not enlarged, symmetric, no tenderness/mass/nodules Lungs: clear to auscultation bilaterally Heart: regular rate and rhythm, S1, S2 normal, no murmur, click, rub or gallop Extremities: extremities normal, atraumatic, no cyanosis or  edema Pulses: 2+ and symmetric Skin: Skin color, texture, turgor normal. No rashes or lesions Neurologic: Alert and oriented X 3, normal strength and tone. Normal symmetric reflexes. Normal coordination and gait  EKG sinus rhythm at 69 with LVH by voltage. I personally reviewed this EKG.  ASSESSMENT AND PLAN:   Palpitations Ms. Gladu sent to me by Dr. Helane Rima for palpitations and atypical chest pain. She's had palpitations now for several months. They occur while while she is exercising as well as at rest. I'm going to get a 30 day event monitor to further evaluate  Atypical chest pain Still has occasional atypical chest pain. She was hospitalized overnight In the hospital on 04/18/17. She ruled out for myocardial infarction. CT scan showed no pulmonary emboli and a Cardiolite showed no ischemia. She has had an occasional episodes that time of much less severity. Her only risk factors include family history of heart disease. This point, we'll continue to follow her clinically.      Lorretta Harp MD FACP,FACC,FAHA, [facility] 06/09/2017 9:46 AM

## 2017-06-09 NOTE — Patient Instructions (Signed)
Medication Instructions: Your physician recommends that you continue on your current medications as directed. Please refer to the Current Medication list given to you today.  If you need a refill on your cardiac medications before your next appointment, please call your pharmacy.   Procedures/Testing: Your physician has requested that you have an echocardiogram. Echocardiography is a painless test that uses sound waves to create images of your heart. It provides your doctor with information about the size and shape of your heart and how well your heart's chambers and valves are working. This procedure takes approximately one hour. There are no restrictions for this procedure. This will take place at [address].  Your physician has recommended that you wear an 30 day event monitor. Event monitors are medical devices that record the heart's electrical activity. Doctors most often [place] these monitors to diagnose arrhythmias. Arrhythmias are problems with the speed or rhythm of the heartbeat. The monitor is a small, portable device. You can wear one while you do your normal daily activities. This is usually used to diagnose what is causing palpitations/syncope (passing out). This will take place at [address]   Follow-Up: Your physician wants you to follow-up in: 2 months with Dr. Gwenlyn Found   Thank you for choosing [facility] at [facility]!!

## 2017-06-09 NOTE — Assessment & Plan Note (Signed)
Still has occasional atypical chest pain. She was hospitalized overnight In the hospital on 04/18/17. She ruled out for myocardial infarction. CT scan showed no pulmonary emboli and a Cardiolite showed no ischemia. She has had an occasional episodes that time of much less severity. Her only risk factors include family history of heart disease. This point, we'll continue to follow her clinically.

## 2017-06-21 ENCOUNTER — Other Ambulatory Visit (HOSPITAL_COMMUNITY): Payer: PPO

## 2017-06-21 ENCOUNTER — Other Ambulatory Visit: Payer: Self-pay

## 2017-06-21 ENCOUNTER — Ambulatory Visit (INDEPENDENT_AMBULATORY_CARE_PROVIDER_SITE_OTHER): Payer: PPO

## 2017-06-21 ENCOUNTER — Ambulatory Visit (HOSPITAL_COMMUNITY): Payer: PPO | Attending: Cardiology

## 2017-06-21 DIAGNOSIS — R002 Palpitations: Secondary | ICD-10-CM

## 2017-06-21 DIAGNOSIS — Z8249 Family history of ischemic heart disease and other diseases of the circulatory system: Secondary | ICD-10-CM | POA: Insufficient documentation

## 2017-06-21 DIAGNOSIS — R079 Chest pain, unspecified: Secondary | ICD-10-CM | POA: Insufficient documentation

## 2017-08-15 ENCOUNTER — Encounter: Payer: Self-pay | Admitting: Cardiovascular Disease

## 2017-08-15 ENCOUNTER — Ambulatory Visit: Payer: PPO | Admitting: Cardiovascular Disease

## 2017-08-15 VITALS — BP 156/84 | HR 70 | Ht 67.0 in | Wt 207.0 lb

## 2017-08-15 DIAGNOSIS — R072 Precordial pain: Secondary | ICD-10-CM

## 2017-08-15 MED ORDER — METOPROLOL SUCCINATE ER 25 MG PO TB24: 25.0000 mg | ORAL_TABLET | Freq: Every day | ORAL | 6 refills | Status: AC

## 2017-08-15 NOTE — Assessment & Plan Note (Signed)
History of atypical chest and jaw pain especially while exercising with a feeling of "tachycardia".  She had a negative Myoview stress test.  I am going to get a coronary CTA to rule out an ischemic etiology.  I am also going to begin her on a low-dose beta-blocker.

## 2017-08-15 NOTE — Progress Notes (Signed)
08/15/2017 [name]   Jun 28, 1950  [id number]  Primary Physician Nicoletta Dress, MD Primary Cardiologist: Lorretta Harp MD Garret Reddish, [place], [place]  HPI:  [name] is a 67 y.o.  mildly overweight married Caucasian female mother of no children accompanied by her husband Henrene Pastor today.   I last saw her in the office 06/09/2017.  Her cardiac risk factors include family history with a father who died of a myocardial infarction age 36. Otherwise she has no cardiac risk factors. She was referred by Dr. Helane Rima for cardiovascular evaluation because of palpitations and atypical chest pain. She was hospitalized on 04/18/17 with chest pain at [facility]. She ruled out for myocardial infarction. A CT scan showed no pulmonary emboli and a Cardiolite stress test was negative for ischemia. She does complain of occasional  Palpitations both at rest and with exercise. She exercises recently for the treadmill, elliptical and recumbent bike. I obtained a 30-day event monitor which showed sinus rhythm/sinus tachycardia.  She continues to have tachypalpitations with chest and jaw pain while exercising.  I am going to get a coronary CTA and begin her on a low-dose beta-blocker.     Current Meds  Medication Sig  . azelastine (ASTELIN) 0.1 % nasal spray Place 2 sprays into both nostrils daily as needed for rhinitis. Use in each nostril as directed  . calcitRIOL (ROCALTROL) 0.25 MCG capsule Take 0.25 mcg by mouth daily.  Marland Kitchen CALCIUM PO Take 1 tablet by mouth 3 (three) times daily with meals.  Marland Kitchen estradiol (CLIMARA - DOSED IN MG/24 HR) 0.1 mg/24hr patch Place 0.1 mg onto the skin every Sunday.  . estradiol (VIVELLE-DOT) 0.075 MG/24HR Place 1 patch onto the skin every Sunday.  . lansoprazole (PREVACID) 30 MG capsule Take 30 mg by mouth daily at 12 noon.  Marland Kitchen levothyroxine (SYNTHROID, LEVOTHROID) 112 MCG tablet Take 112 mcg by mouth 2 (two) times daily.  . Multiple Vitamin (MULTIVITAMIN WITH MINERALS)  TABS tablet Take 1 tablet by mouth daily.  . ranitidine (ZANTAC) 300 MG tablet Take 300 mg by mouth at bedtime.     Allergies  Allergen Reactions  . Ciprofloxacin Anaphylaxis and Swelling  . Penicillins Other (See Comments)    REACTION: Unknown.  Childhood allergy.  . Tape     Plastic   / adhesive    Social History   Socioeconomic History  . Marital status: Married    Spouse name: Not on file  . Number of children: Not on file  . Years of education: Not on file  . Highest education level: Not on file  Occupational History  . Not on file  Social Needs  . Financial resource strain: Not on file  . Food insecurity:    Worry: Not on file    Inability: Not on file  . Transportation needs:    Medical: Not on file    Non-medical: Not on file  Tobacco Use  . Smoking status: Former Smoker    Types: Cigarettes    Last attempt to quit: 04/18/1977    Years since quitting: 40.3  . Smokeless tobacco: Never Used  Substance and Sexual Activity  . Alcohol use: No  . Drug use: No  . Sexual activity: Not on file  Lifestyle  . Physical activity:    Days per week: Not on file    Minutes per session: Not on file  . Stress: Not on file  Relationships  . Social connections:    Talks  on phone: Not on file    Gets together: Not on file    Attends religious service: Not on file    Active member of club or organization: Not on file    Attends meetings of clubs or organizations: Not on file    Relationship status: Not on file  . Intimate partner violence:    Fear of current or ex partner: Not on file    Emotionally abused: Not on file    Physically abused: Not on file    Forced sexual activity: Not on file  Other Topics Concern  . Not on file  Social History Narrative  . Not on file     Review of Systems: General: negative for chills, fever, night sweats or weight changes.  Cardiovascular: negative for chest pain, dyspnea on exertion, edema, orthopnea, palpitations, paroxysmal  nocturnal dyspnea or shortness of breath Dermatological: negative for rash Respiratory: negative for cough or wheezing Urologic: negative for hematuria Abdominal: negative for nausea, vomiting, diarrhea, bright red blood per rectum, melena, or hematemesis Neurologic: negative for visual changes, syncope, or dizziness All other systems reviewed and are otherwise negative except as noted above.    Blood pressure (!) 156/84, pulse 70, height 5\' 7"  (1.702 m), weight 207 lb (93.9 kg).  General appearance: alert and no distress Neck: no adenopathy, no carotid bruit, no JVD, supple, symmetrical, trachea midline and thyroid not enlarged, symmetric, no tenderness/mass/nodules Lungs: clear to auscultation bilaterally Heart: regular rate and rhythm, S1, S2 normal, no murmur, click, rub or gallop Extremities: extremities normal, atraumatic, no cyanosis or edema Pulses: 2+ and symmetric Skin: Skin color, texture, turgor normal. No rashes or lesions Neurologic: Alert and oriented X 3, normal strength and tone. Normal symmetric reflexes. Normal coordination and gait  EKG not performed today  ASSESSMENT AND PLAN:   Palpitations History of palpitations with recent event monitor that showed sinus rhythm/sinus tachycardia.  Atypical chest pain History of atypical chest and jaw pain especially while exercising with a feeling of "tachycardia".  She had a negative Myoview stress test.  I am going to get a coronary CTA to rule out an ischemic etiology.  I am also going to begin her on a low-dose beta-blocker.      Lorretta Harp MD FACP,FACC,FAHA, [facility] 08/15/2017 11:44 AM

## 2017-08-15 NOTE — Assessment & Plan Note (Signed)
History of palpitations with recent event monitor that showed sinus rhythm/sinus tachycardia.

## 2017-08-15 NOTE — Patient Instructions (Signed)
Medication Instructions: Your physician recommends that you continue on your current medications as directed. Please refer to the Current Medication list given to you today.  START Metoprolol 25 mg daily.   Labwork: Your physician recommends that you return for lab work prior to CTA--BMET   Testing/Procedures: Please arrive at the [facility] main entrance of [facility] at                    AM (30-45 minutes prior to test start time)  [address], [place] [address] [phone]  Proceed to the [facility] Radiology Department (First Floor).  Please follow these instructions carefully (unless otherwise directed):  On the Night Before the Test: . Drink plenty of water. . Do not consume any caffeinated/decaffeinated beverages or chocolate 12 hours prior to your test. . Do not take any antihistamines 12 hours prior to your test.  On the Day of the Test: . Drink plenty of water. Do not drink any water within one hour of the test. . Do not eat any food 4 hours prior to the test. . You may take your regular medications prior to the test. .  After the Test: . Drink plenty of water. . After receiving IV contrast, you may experience a mild flushed feeling. This is normal. . On occasion, you may experience a mild rash up to 24 hours after the test. This is not dangerous. If this occurs, you can take Benadryl 25 mg and increase your fluid intake. . If you experience trouble breathing, this can be serious. If it is severe call 911 IMMEDIATELY. If it is mild, please call our office. . If you take any of these medications: Glipizide/Metformin, Avandament, Glucavance, please do not take 48 hours after completing test.   Follow-Up: We request that you follow-up in: 3 months with an extender and in 6 months with Dr Andria Rhein will receive a reminder letter in the mail two months in advance. If you don't receive a letter, please call our office to  schedule the follow-up appointment.  If you need a refill on your cardiac medications before your next appointment, please call your pharmacy.

## 2017-09-21 LAB — BASIC METABOLIC PANEL
BUN/Creatinine Ratio: 26 (ref 12–28)
BUN: 12 mg/dL (ref 8–27)
CALCIUM: 9.1 mg/dL (ref 8.7–10.3)
CO2: 22 mmol/L (ref 20–29)
Chloride: 101 mmol/L (ref 96–106)
Creatinine, Ser: 0.46 mg/dL — ABNORMAL LOW (ref 0.57–1.00)
GFR, EST AFRICAN AMERICAN: 121 mL/min/{1.73_m2} (ref >59)
GFR, EST NON AFRICAN AMERICAN: 105 mL/min/{1.73_m2} (ref >59)
Glucose: 86 mg/dL (ref 65–99)
POTASSIUM: 4.3 mmol/L (ref 3.5–5.2)
Sodium: 138 mmol/L (ref 134–144)

## 2017-09-27 ENCOUNTER — Ambulatory Visit (HOSPITAL_COMMUNITY)
Admission: RE | Admit: 2017-09-27 | Discharge: 2017-09-27 | Disposition: A | Discharge status: Home / Self Care | Payer: PPO | Source: Ambulatory Visit | Attending: Cardiovascular Disease | Admitting: Cardiovascular Disease

## 2017-09-27 DIAGNOSIS — R072 Precordial pain: Secondary | ICD-10-CM | POA: Insufficient documentation

## 2017-09-27 DIAGNOSIS — R0789 Other chest pain: Secondary | ICD-10-CM | POA: Diagnosis not present

## 2017-09-27 MED ORDER — IOPAMIDOL (ISOVUE-370) INJECTION 76%
INTRAVENOUS | Status: AC
  Filled 2017-09-27: qty 100

## 2017-09-27 MED ORDER — NITROGLYCERIN 0.4 MG SL SUBL
SUBLINGUAL_TABLET | SUBLINGUAL | Status: AC
  Administered 2017-09-27: 0.8 mg
  Filled 2017-09-27: qty 1

## 2017-09-27 MED ORDER — IOPAMIDOL (ISOVUE-370) INJECTION 76%
100.0000 mL | Freq: Once | INTRAVENOUS | Status: AC | PRN
  Administered 2017-09-27: 100 mL via INTRAVENOUS

## 2017-09-27 MED ORDER — METOPROLOL TARTRATE 5 MG/5ML IV SOLN
INTRAVENOUS | Status: AC
  Administered 2017-09-27: 5 mg
  Filled 2017-09-27: qty 5

## 2021-01-20 ENCOUNTER — Other Ambulatory Visit (HOSPITAL_BASED_OUTPATIENT_CLINIC_OR_DEPARTMENT_OTHER): Payer: Self-pay
